# Patient Record
Sex: Female | Born: 1950 | Hispanic: No | Marital: Married | Smoking: Former smoker
Health system: Southern US, Community
[De-identification: ages and names within clinical notes are randomized; demographics above are authoritative.]

## PROBLEM LIST (undated history)

## (undated) DIAGNOSIS — Z923 Personal history of irradiation: Secondary | ICD-10-CM

## (undated) DIAGNOSIS — C50919 Malignant neoplasm of unspecified site of unspecified female breast: Secondary | ICD-10-CM

## (undated) DIAGNOSIS — I10 Essential (primary) hypertension: Secondary | ICD-10-CM

## (undated) HISTORY — DX: Essential (primary) hypertension: I10

## (undated) HISTORY — PX: TONSILLECTOMY: SUR1361

---

## 2012-07-17 ENCOUNTER — Other Ambulatory Visit: Payer: Self-pay | Admitting: Specialist

## 2012-07-17 ENCOUNTER — Ambulatory Visit
Admission: RE | Admit: 2012-07-17 | Discharge: 2012-07-17 | Disposition: A | Discharge status: Home / Self Care | Payer: No Typology Code available for payment source | Source: Ambulatory Visit | Attending: Specialist | Admitting: Specialist

## 2012-07-17 DIAGNOSIS — R7611 Nonspecific reaction to tuberculin skin test without active tuberculosis: Secondary | ICD-10-CM

## 2016-09-10 HISTORY — PX: COLONOSCOPY: SHX174

## 2016-09-18 ENCOUNTER — Other Ambulatory Visit: Payer: Self-pay | Admitting: Internal Medicine

## 2016-09-18 DIAGNOSIS — Z1231 Encounter for screening mammogram for malignant neoplasm of breast: Secondary | ICD-10-CM

## 2016-10-02 ENCOUNTER — Ambulatory Visit: Payer: Self-pay

## 2016-10-12 ENCOUNTER — Ambulatory Visit
Admission: RE | Admit: 2016-10-12 | Discharge: 2016-10-12 | Disposition: A | Discharge status: Home / Self Care | Payer: Medicare Other | Source: Ambulatory Visit | Attending: Internal Medicine | Admitting: Internal Medicine

## 2016-10-12 ENCOUNTER — Encounter: Payer: Self-pay | Admitting: Radiology

## 2016-10-12 DIAGNOSIS — Z1231 Encounter for screening mammogram for malignant neoplasm of breast: Secondary | ICD-10-CM

## 2016-10-15 ENCOUNTER — Other Ambulatory Visit: Payer: Self-pay | Admitting: Internal Medicine

## 2016-10-15 ENCOUNTER — Encounter: Payer: Self-pay | Admitting: Gastroenterology

## 2016-10-15 DIAGNOSIS — R928 Other abnormal and inconclusive findings on diagnostic imaging of breast: Secondary | ICD-10-CM

## 2016-11-16 ENCOUNTER — Ambulatory Visit
Admission: RE | Admit: 2016-11-16 | Discharge: 2016-11-16 | Disposition: A | Discharge status: Home / Self Care | Payer: Medicare Other | Source: Ambulatory Visit | Attending: Internal Medicine | Admitting: Internal Medicine

## 2016-11-16 ENCOUNTER — Other Ambulatory Visit: Payer: Self-pay | Admitting: Internal Medicine

## 2016-11-16 DIAGNOSIS — R928 Other abnormal and inconclusive findings on diagnostic imaging of breast: Secondary | ICD-10-CM

## 2016-11-16 DIAGNOSIS — N6489 Other specified disorders of breast: Secondary | ICD-10-CM

## 2016-11-27 ENCOUNTER — Ambulatory Visit
Admission: RE | Admit: 2016-11-27 | Discharge: 2016-11-27 | Disposition: A | Discharge status: Home / Self Care | Payer: Medicare Other | Source: Ambulatory Visit | Attending: Internal Medicine | Admitting: Internal Medicine

## 2016-11-27 ENCOUNTER — Other Ambulatory Visit: Payer: Self-pay | Admitting: Internal Medicine

## 2016-11-27 DIAGNOSIS — R928 Other abnormal and inconclusive findings on diagnostic imaging of breast: Secondary | ICD-10-CM

## 2016-11-27 DIAGNOSIS — N6489 Other specified disorders of breast: Secondary | ICD-10-CM

## 2016-11-28 ENCOUNTER — Telehealth: Payer: Self-pay | Admitting: *Deleted

## 2016-11-28 NOTE — Telephone Encounter (Signed)
Confirmed [facility] for 12/05/16 at 815am .  Instructions and contact information given. Offered interpreter to husband for patient and he refused.  He speaks [place] and will translate.  Informed him he will need to sign a waiver at check in.

## 2016-11-30 ENCOUNTER — Other Ambulatory Visit: Payer: Self-pay | Admitting: *Deleted

## 2016-11-30 DIAGNOSIS — Z170 Estrogen receptor positive status [ER+]: Principal | ICD-10-CM

## 2016-11-30 DIAGNOSIS — C50211 Malignant neoplasm of upper-inner quadrant of right female breast: Secondary | ICD-10-CM | POA: Insufficient documentation

## 2016-12-05 ENCOUNTER — Other Ambulatory Visit: Payer: Self-pay | Admitting: *Deleted

## 2016-12-05 ENCOUNTER — Ambulatory Visit: Payer: Medicare Other | Attending: Surgery | Admitting: Physical Therapy

## 2016-12-05 ENCOUNTER — Ambulatory Visit
Admission: RE | Admit: 2016-12-05 | Discharge: 2016-12-05 | Disposition: A | Discharge status: Home / Self Care | Payer: Medicare Other | Source: Ambulatory Visit | Attending: Radiation Oncology | Admitting: Radiation Oncology

## 2016-12-05 ENCOUNTER — Encounter: Payer: Self-pay | Admitting: *Deleted

## 2016-12-05 ENCOUNTER — Encounter: Payer: Self-pay | Admitting: Hematology

## 2016-12-05 ENCOUNTER — Ambulatory Visit (HOSPITAL_BASED_OUTPATIENT_CLINIC_OR_DEPARTMENT_OTHER): Payer: Medicare Other | Admitting: Hematology

## 2016-12-05 ENCOUNTER — Other Ambulatory Visit (HOSPITAL_BASED_OUTPATIENT_CLINIC_OR_DEPARTMENT_OTHER): Payer: Medicare Other

## 2016-12-05 ENCOUNTER — Encounter: Payer: Self-pay | Admitting: Physical Therapy

## 2016-12-05 VITALS — BP 159/71 | HR 76 | Temp 98.3°F | Resp 19 | Ht 64.0 in | Wt 207.1 lb

## 2016-12-05 DIAGNOSIS — C50211 Malignant neoplasm of upper-inner quadrant of right female breast: Secondary | ICD-10-CM

## 2016-12-05 DIAGNOSIS — Z170 Estrogen receptor positive status [ER+]: Principal | ICD-10-CM

## 2016-12-05 DIAGNOSIS — R293 Abnormal posture: Secondary | ICD-10-CM | POA: Diagnosis present

## 2016-12-05 DIAGNOSIS — Z720 Tobacco use: Secondary | ICD-10-CM

## 2016-12-05 DIAGNOSIS — I10 Essential (primary) hypertension: Secondary | ICD-10-CM | POA: Diagnosis not present

## 2016-12-05 DIAGNOSIS — C50311 Malignant neoplasm of lower-inner quadrant of right female breast: Secondary | ICD-10-CM | POA: Insufficient documentation

## 2016-12-05 LAB — COMPREHENSIVE METABOLIC PANEL
ALBUMIN: 4 g/dL (ref 3.5–5.0)
ALT: 19 U/L (ref 0–55)
AST: 15 U/L (ref 5–34)
Alkaline Phosphatase: 85 U/L (ref 40–150)
Anion Gap: 10 mEq/L (ref 3–11)
BUN: 13.4 mg/dL (ref 7.0–26.0)
CO2: 25 meq/L (ref 22–29)
Calcium: 9.5 mg/dL (ref 8.4–10.4)
Chloride: 105 mEq/L (ref 98–109)
Creatinine: 0.8 mg/dL (ref 0.6–1.1)
EGFR: 77 mL/min/{1.73_m2} — ABNORMAL LOW (ref >90)
GLUCOSE: 115 mg/dL (ref 70–140)
POTASSIUM: 4.7 meq/L (ref 3.5–5.1)
SODIUM: 139 meq/L (ref 136–145)
Total Bilirubin: 0.38 mg/dL (ref 0.20–1.20)
Total Protein: 7.5 g/dL (ref 6.4–8.3)

## 2016-12-05 LAB — CBC WITH DIFFERENTIAL/PLATELET
BASO%: 2.2 % — ABNORMAL HIGH (ref 0.0–2.0)
BASOS ABS: 0.1 10*3/uL (ref 0.0–0.1)
EOS%: 2.7 % (ref 0.0–7.0)
Eosinophils Absolute: 0.2 10*3/uL (ref 0.0–0.5)
HCT: 40.6 % (ref 34.8–46.6)
HEMOGLOBIN: 13.8 g/dL (ref 11.6–15.9)
LYMPH#: 1.9 10*3/uL (ref 0.9–3.3)
LYMPH%: 34.6 % (ref 14.0–49.7)
MCH: 31.2 pg (ref 25.1–34.0)
MCHC: 33.9 g/dL (ref 31.5–36.0)
MCV: 92 fL (ref 79.5–101.0)
MONO#: 0.7 10*3/uL (ref 0.1–0.9)
MONO%: 12.2 % (ref 0.0–14.0)
NEUT#: 2.7 10*3/uL (ref 1.5–6.5)
NEUT%: 48.3 % (ref 38.4–76.8)
Platelets: 283 10*3/uL (ref 145–400)
RBC: 4.42 10*6/uL (ref 3.70–5.45)
RDW: 13.4 % (ref 11.2–14.5)
WBC: 5.6 10*3/uL (ref 3.9–10.3)

## 2016-12-05 NOTE — Therapy (Signed)
[place], [place], [address] Phone: [phone]   Fax:  [phone]  Physical Therapy Evaluation  Patient Details  Name: Crystal Logan MRN: [id number] Date of Birth: [birth year] Referring Provider: Dr. Alphonsa Overall  Encounter Date: 12/05/2016      PT End of Session - 12/05/16 1159    Visit Number 1   Number of Visits 1   PT Start Time 0920   PT Stop Time 0942  Also saw pt from 1120-1130 for a total of 32 minutes   PT Time Calculation (min) 22 min   Activity Tolerance Patient tolerated treatment well   Behavior During Therapy [facility] for tasks assessed/performed      Past Medical History:  Diagnosis Date  . Hypertension     History reviewed. No pertinent surgical history.  There were no vitals filed for this visit.       Subjective Assessment - 12/05/16 1147    Subjective Patient reports she is here today to be seen by her medical team for her newly diagnosed breast cancer.   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 The size was unable to be measured because it was viewed as a distortion on ultrasound and mammogram but she is scheduled to have an MRI. It is located in the lower inner quadrant, is ER/PR positive and HER2 negative with a Ki67 of 5%.   Patient Stated Goals reduce lymphedema risk and learn post op shoulder ROM HEP   Currently in Pain? No/denies            [facility] PT Assessment - 12/05/16 0001      Assessment   Medical Diagnosis Right breast cancer   Referring Provider Dr. Alphonsa Overall   Onset Date/Surgical Date 10/12/16   Hand Dominance Right   Prior Therapy none     Precautions   Precautions Other (comment)   Precaution Comments active cancer     Restrictions   Weight Bearing Restrictions No     Balance Screen   Has the patient fallen in the past 6 months  No   Has the patient had a decrease in activity level because of a fear of falling?  No   Is the patient reluctant to leave their home because of a fear of falling?  No     Home Ecologist residence   Living Arrangements Spouse/significant other   Available Help at Discharge Family     Prior Function   Level of [place] Retired   Leisure She does not exercise     Cognition   Overall Cognitive Status Within Functional Limits for tasks assessed     Posture/Postural Control   Posture/Postural Control Postural limitations   Postural Limitations Forward head;Rounded Shoulders     ROM / Strength   AROM / PROM / Strength AROM;Strength     AROM   AROM Assessment Site Shoulder;Cervical   Right/Left Shoulder Right;Left   Right Shoulder Extension 55 Degrees   Right Shoulder Flexion 145 Degrees   Right Shoulder ABduction 159 Degrees   Right Shoulder Internal Rotation 69 Degrees   Right Shoulder External Rotation 75 Degrees   Left Shoulder Extension 63 Degrees   Left Shoulder Flexion 142 Degrees   Left Shoulder ABduction 158 Degrees   Left Shoulder Internal Rotation 63 Degrees   Left Shoulder External Rotation  82 Degrees   Cervical Flexion WNL   Cervical Extension WNL   Cervical - Right Side Bend WNL   Cervical - Left Side Bend WNL   Cervical - Right Rotation WNL   Cervical - Left Rotation WNL     Strength   Overall Strength Within functional limits for tasks performed           LYMPHEDEMA/ONCOLOGY QUESTIONNAIRE - 12/05/16 1156      Type   Cancer Type Right breast cancer     Lymphedema Assessments   Lymphedema Assessments Upper extremities     Right Upper Extremity Lymphedema   10 cm Proximal to Olecranon Process 32.9 cm   Olecranon Process 28 cm   10 cm Proximal to Ulnar Styloid Process 26.5 cm   Just Proximal to Ulnar Styloid Process 17.7 cm   Across Hand at [employer] 21.5 cm   At [place] of 2nd Digit  7.1 cm     Left Upper Extremity Lymphedema   10 cm Proximal to Olecranon Process 32.2 cm   Olecranon Process 28.6 cm   10 cm Proximal to Ulnar Styloid Process 25.4 cm   Just Proximal to Ulnar Styloid Process 17.8 cm   Across Hand at [employer] 21.3 cm   At [place] of 2nd Digit 7 cm           Patient was instructed today in a home exercise program today for post op shoulder range of motion. These included active assist shoulder flexion in sitting, scapular retraction, wall walking with shoulder abduction, and hands behind head external rotation.  She was encouraged to do these twice a day, holding 3 seconds and repeating 5 times when permitted by her physician.                 PT Education - 12/05/16 1157    Education provided Yes   Education Details Lymphedema risk reduction and post op shoulder ROM HEP   Person(s) Educated Patient;Spouse   Methods Explanation;Demonstration;Handout   Comprehension Returned demonstration;Verbalized understanding              Breast Clinic Goals - 12/05/16 1204      Patient will be able to verbalize understanding of pertinent lymphedema risk reduction practices relevant to her diagnosis specifically related to skin care.   Time 1   Period Days   Status Achieved     Patient will be able to return demonstrate and/or verbalize understanding of the post-op home exercise program related to regaining shoulder range of motion.   Time 1   Period Days   Status Achieved     Patient will be able to verbalize understanding of the importance of attending the postoperative After Breast Cancer Class for further lymphedema risk reduction education and therapeutic exercise.   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 The size was unable to be measured because it was viewed  as a distortion on ultrasound and mammogram but she is scheduled to have an MRI. It is located in the lower inner quadrant, is ER/PR positive and HER2 negative with a Ki67 of 5%. Her multidisciplinary medical team met prior to her assessments to determine a recommended treatment plan. She is planning to have a right lumpectomy with a sentinel  node biopsy followed by Oncotype testing, radiation, and anti-estrogen therapy. She may benefit from post op PT to regain shoulder ROM and reduce lymphedema risk. Due to her alck of comorbidities, her eval is of low complexity.   Rehab Potential Excellent   Clinical Impairments Affecting Rehab Potential None except language barrier   PT Frequency One time visit   PT Treatment/Interventions Therapeutic exercise;Patient/family education   PT Next Visit Plan Will f/u after surgery to determine PT needs   PT Home Exercise Plan Post op shoulder ROM HEP   Consulted and Agree with Plan of Care Patient;Family member/caregiver   Family Member Consulted Husband      Patient will benefit from skilled therapeutic intervention in order to improve the following deficits and impairments:  Decreased range of motion, Pain, Decreased knowledge of precautions, Postural dysfunction  Visit Diagnosis: Abnormal posture - Plan: PT plan of care cert/re-cert  Carcinoma of lower-inner quadrant of right breast in female, estrogen receptor positive ([place]) - Plan: PT plan of care cert/re-cert  Patient will follow up at outpatient cancer rehab if needed following surgery.  If the patient requires physical therapy at that time, a specific plan will be dictated and sent to the referring physician for approval. The patient was educated today on appropriate basic range of motion exercises to begin post operatively and the importance of attending the After Breast Cancer class following surgery.  Patient was educated today on lymphedema risk reduction practices as it pertains to recommendations  that will benefit the patient immediately following surgery.  She verbalized good understanding.  No additional physical therapy is indicated at this time.        G-Codes - 12-30-16 1204    Functional Assessment Tool Used (Outpatient Only) Clinical Judgement   Functional Limitation Other PT primary   Other PT Primary Current Status (P9432) At least 1 percent but less than 20 percent impaired, limited or restricted   Other PT Primary Goal Status (X6147) At least 1 percent but less than 20 percent impaired, limited or restricted   Other PT Primary Discharge Status (W9295) At least 1 percent but less than 20 percent impaired, limited or restricted       Problem List Patient Active Problem List   Diagnosis Date Noted  . Malignant neoplasm of upper-inner quadrant of right breast in female, estrogen receptor positive ([place]) 11/30/2016    Annia Friendly, PT 12-30-16 12:06 PM  [place], [place], [address] Phone: [phone]   Fax:  [phone]  Name: Nakiya Rallis MRN: [id number] Date of Birth: 09-02-1951

## 2016-12-05 NOTE — Progress Notes (Signed)
Nutrition Assessment  Reason for Assessment:  Pt seen in Breast Clinic  ASSESSMENT:   66 year old female with new diagnosis of right breast mass.  Past medical history reviewed.  Patient husband refused interpreter for patient.    Husband reports patient with good appetite.  Medications:  reviewed  Labs: reviewed  Anthropometrics:   Height: 64 inches Weight: 207 lb BMI: 35.6   NUTRITION DIAGNOSIS: Food and nutrition related knowledge deficit related to new diagnosis of breast cancer as evidenced by no prior need for nutrition related information.  INTERVENTION:   Discussed and provided packet of information regarding nutritional tips for breast cancer patients.  Questions answered.  Teachback method used.  Contact information provided and patient knows to contact me with questions/concerns.    MONITORING, EVALUATION, and GOAL: Pt will consume a healthy plant based diet to maintain lean body mass throughout treatment.   Marae Cottrell B. Zenia Resides, [place], [place] Registered Dietitian [phone] (pager)

## 2016-12-05 NOTE — Patient Instructions (Signed)

## 2016-12-05 NOTE — Progress Notes (Signed)
Clinical Social Work [facility] Psychosocial Distress Screening Crystal Logan  Patient completed distress screening protocol and scored a 3 on the Psychosocial Distress Thermometer which indicates mild distress. Clinical Social Worker met with patient and patients husband in [facility] to assess for distress and other psychosocial needs. Patient stated she was feeling overwhelmed but felt "better" after meeting with the treatment team and getting more information on her treatment plan. CSW and patient discussed common feeling and emotions when being diagnosed with cancer, and the importance of support during treatment. CSW informed patient of the support team and support services at [facility]. CSW provided contact information and encouraged patient to call with any questions or concerns.   ONCBCN DISTRESS SCREENING 12/05/2016  Screening Type Initial Screening  Distress experienced in past week (1-10) 3  Practical problem type Insurance  Information Concerns Type Lack of info about diagnosis;Lack of info about treatment    Crystal Logan, MSW, LCSW, OSW-C Clinical Social Worker [facility] [phone]

## 2016-12-05 NOTE — Progress Notes (Signed)
Radiation Oncology         [phone] ________________________________  Initial outpatient Consultation  Name: Crystal Logan MRN: [id number]  Date: 12/05/2016  DOB: 1950-10-18  FK:CLEXNTZ,GYF, MD  Alphonsa Overall, MD   REFERRING PHYSICIAN: Alphonsa Overall, MD  DIAGNOSIS:    ICD-9-CM ICD-10-CM   1. 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 female who had a screening detected right breast asymmetry at the 3 o'clock position. No correlate on ultrasound. Axilla negative on ultrasound. 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   On review of systems the patient denies any concerns at this time. It should be noted the patient's husband is translating for her during this consultation. An independent translator was offered several times and declined by the patient.   Of note, the patient was age 52 at first menstrual period. She is postmenopausal. The patient has carried one child to term. Her last colonoscopy was 2016. She has never had a bone density scan. The patient's first mammogram was February 2018.   The patient is a current smoker - 5 to 6 cigs daily, smoker since age 51.  PREVIOUS RADIATION THERAPY: No  PAST MEDICAL HISTORY:  has a past medical history of Hypertension.    PAST SURGICAL HISTORY: no other surgeries reported by patient  FAMILY HISTORY: no  other cancers reported by patient in family  SOCIAL HISTORY:  reports that she has been smoking.  She has a 23.50 pack-year smoking history. She has never used smokeless tobacco. She reports that she drinks alcohol. She reports that she does not use drugs.  The patient is a current smoker.  ALLERGIES: Patient has no known allergies.  MEDICATIONS:  Current Outpatient Prescriptions  Medication Sig Dispense Refill  . olmesartan-hydrochlorothiazide (BENICAR HCT) 20-12.5 MG tablet Take 1 tablet by mouth daily.     No current facility-administered medications for this encounter.     REVIEW OF SYSTEMS: A 10+ POINT REVIEW OF SYSTEMS WAS OBTAINED including neurology, dermatology, psychiatry, cardiac, respiratory, lymph, extremities, GI, GU, Musculoskeletal, constitutional, breasts, reproductive, HEENT.  All pertinent positives are noted in the HPI.  All others are negative.   PHYSICAL EXAM:  Vitals with BMI 12/05/2016  Height _0   Weight 207 lbs 2 oz  BMI 74.9  Systolic 449  Diastolic 71  Pulse 76  Respirations 19  General: Alert and oriented, in no acute distress. HEENT: Oropharynx and oral cavity are clear. Neck: Neck is supple, no palpable cervical or supraclavicular lymphadenopathy. Heart: Regular in rate and rhythm with no murmurs. Chest: Clear to auscultation bilaterally. Abdomen: Soft, non tender, non distended. Extremities: No edema in ankles or wrists. Lymphatics: see Neck Exam Skin: see Breast Exam. Breasts: On examination of the left breast no palpable masses are noted in the breast or axilla. On examination of the right breast there is a bruise from previous biopsy in the upper-inner quadrant. No obvious  mass appreciated under the biopsy site other than some superficial thickening approximately >1 cm in size which could be post-biopsy change. No palpable axillary nodes.   ECOG = 0   LABORATORY DATA:  Lab Results  Component Value Date   WBC 5.6 12/05/2016   HGB 13.8  12/05/2016   HCT 40.6 12/05/2016   MCV 92.0 12/05/2016   PLT 283 12/05/2016   CMP     Component Value Date/Time   NA 139 12/05/2016 0829   K 4.7 12/05/2016 0829   CO2 25 12/05/2016 0829   GLUCOSE 115 12/05/2016 0829   BUN 13.4 12/05/2016 0829   CREATININE 0.8 12/05/2016 0829   CALCIUM 9.5 12/05/2016 0829   PROT 7.5 12/05/2016 0829   ALBUMIN 4.0 12/05/2016 0829   AST 15 12/05/2016 0829   ALT 19 12/05/2016 0829   ALKPHOS 85 12/05/2016 0829   BILITOT 0.38 12/05/2016 0829     RADIOGRAPHY: US Breast Ltd Uni Right Inc Axilla  Result Date: 11/16/2016 CLINICAL DATA:  Patient returns today to evaluate a possible right breast asymmetry identified on recent baseline screening mammogram. EXAM: 2D DIGITAL DIAGNOSTIC RIGHT MAMMOGRAM WITH CAD AND ADJUNCT TOMO ULTRASOUND RIGHT BREAST COMPARISON:  Baseline screening mammogram dated 10/12/2016. ACR Breast Density Category b: There are scattered areas of fibroglandular density. FINDINGS: On today's additional views with 3D tomosynthesis, there is a persistent asymmetry within the inner right breast, 3 o'clock axis region, at middle depth, with associated architectural distortion, best seen on CC tomosynthesis slice 44. Mammographic images were processed with CAD. Targeted ultrasound is performed, evaluating the inner and retroareolar right breast corresponding to the area of mammographic findings, showing no definite corresponding mass or distortion. Right axillary ultrasound was performed showing no enlarged or morphologically abnormal lymph nodes. IMPRESSION: Suspicious asymmetry/distortion within the inner right breast, 3 o'clock axis region, at middle depth, without definite sonographic correlate, for which stereotactic biopsy with 3D tomosynthesis guidance is recommended. RECOMMENDATION: Stereotactic biopsy, with 3D tomosynthesis guidance, for the suspicious right breast asymmetry/distortion. Stereotactic biopsy is scheduled for March 20th. These results  and recommendations were called by telephone at the time of interpretation on 11/16/2016 at 8:40 am to Dr. Jilda Panda , who verbally acknowledged these results. I have discussed the findings and recommendations with the patient. Results were also provided in writing at the conclusion of the visit. If applicable, a reminder letter will be sent to the patient regarding the next appointment. BI-RADS CATEGORY  4: Suspicious. Electronically Signed   By: Franki Cabot M.D.   On: 11/16/2016 08:42   Mm Diag Breast Tomo Uni Right  Result Date: 11/16/2016 CLINICAL DATA:  Patient returns today to evaluate a possible right breast asymmetry identified on recent baseline screening mammogram. EXAM: 2D DIGITAL DIAGNOSTIC RIGHT MAMMOGRAM WITH CAD AND ADJUNCT TOMO ULTRASOUND RIGHT BREAST COMPARISON:  Baseline screening mammogram dated 10/12/2016. ACR Breast Density Category b: There are scattered areas of fibroglandular density. FINDINGS: On today's additional views with 3D tomosynthesis, there is a persistent asymmetry within the inner right breast, 3 o'clock axis region, at middle depth, with associated architectural distortion, best seen on CC tomosynthesis slice 44. Mammographic images were processed with CAD. Targeted ultrasound is performed, evaluating the inner and retroareolar right breast corresponding to the area of mammographic findings, showing no definite corresponding mass or distortion. Right axillary ultrasound was performed showing no enlarged or morphologically abnormal lymph nodes. IMPRESSION: Suspicious asymmetry/distortion within the inner right breast, 3 o'clock axis region, at middle depth, without definite sonographic correlate,  for which stereotactic biopsy with 3D tomosynthesis guidance is recommended. RECOMMENDATION: Stereotactic biopsy, with 3D tomosynthesis guidance, for the suspicious right breast asymmetry/distortion. Stereotactic biopsy is scheduled for March 20th. These results and recommendations  were called by telephone at the time of interpretation on 11/16/2016 at 8:40 am to Dr. Jilda Panda , who verbally acknowledged these results. I have discussed the findings and recommendations with the patient. Results were also provided in writing at the conclusion of the visit. If applicable, a reminder letter will be sent to the patient regarding the next appointment. BI-RADS CATEGORY  4: Suspicious. Electronically Signed   By: Franki Cabot M.D.   On: 11/16/2016 08:42   Mm Clip Placement Right  Result Date: 11/27/2016 CLINICAL DATA:  Post biopsy mammogram of the right breast for clip placement. EXAM: DIAGNOSTIC RIGHT MAMMOGRAM POST STEREOTACTIC BIOPSY COMPARISON:  Previous exam(s). FINDINGS: Mammographic images were obtained following stereotactic guided biopsy of asymmetry/distortion in the right breast. The coil shaped biopsy marking clip is approximately 1.5 cm superior to the site of biopsy at the approximate 3 o'clock position in the right breast. IMPRESSION: The coil shaped biopsy marking clip is approximately 1.5 cm superior to the site targeted for biopsy. Final Assessment: Post Procedure Mammograms for Marker Placement Electronically Signed   By: Ammie Ferrier M.D.   On: 11/27/2016 08:42   Mm Rt Breast Bx W Loc Dev 1st Lesion Image Bx Spec Stereo Guide  Addendum Date: 11/28/2016   ADDENDUM REPORT: 11/28/2016 11:58 ADDENDUM: Pathology revealed grade I to II invasive mammary carcinoma in the right breast. This was found to be concordant by Dr. Ammie Ferrier. Pathology results were discussed with the patient's husband, who served as an interpreter, by telephone. The patient reported doing well after the biopsy. Post biopsy instructions and care were reviewed and questions were answered. The patient was encouraged to call [facility] for any additional concerns. The patient was referred to the [facility] at the [facility] on December 05, 2016. Pathology results reported by Susa Raring RN, BSN on 11/28/2016. Electronically Signed   By: Ammie Ferrier M.D.   On: 11/28/2016 11:58   Result Date: 11/28/2016 CLINICAL DATA:  66 year old female presenting for stereotactic biopsy of a right breastasymmetry/distortion. EXAM: RIGHT BREAST STEREOTACTIC CORE NEEDLE BIOPSY COMPARISON:  Previous exams. FINDINGS: The patient and I discussed the procedure of stereotactic-guided biopsy including benefits and alternatives. We discussed the high likelihood of a successful procedure. We discussed the risks of the procedure including infection, bleeding, tissue injury, clip migration, and inadequate sampling. Informed written consent was given. The usual time out protocol was performed immediately prior to the procedure. Using sterile technique and 1% Lidocaine as local anesthetic, under stereotactic guidance, a 9 gauge vacuum assisted device was used to perform core needle biopsy of asymmetry/distortion in the medial right breast using a superior approach. At the conclusion of the procedure, a coil shaped tissue marker clip was deployed into the biopsy cavity. Follow-up 2-view mammogram was performed and dictated separately. IMPRESSION: Stereotactic-guided biopsy of asymmetry/distortion in the medial right breast. No apparent complications. Electronically Signed: By: Ammie Ferrier M.D. On: 11/27/2016 08:37      IMPRESSION/PLAN: Crystal Logan is a delightful patient with right breast cancer.  She plans to have an MRI to stage her breasts, and the breast conserving surgery.  It was a pleasure meeting the patient today. We discussed the risks, benefits, and side effects of adjuvant radiotherapy.  I recommend radiotherapy to the right breast to reduce her risk of locoregional recurrence by 2/3.  We discussed that radiation would take approximately 6 weeks to complete and that I would give the patient a few weeks to heal following surgery before  starting treatment planning. If chemotherapy were to be given, this would precede radiotherapy. We spoke about acute effects including skin irritation and fatigue as well as much less common late effects including internal organ injury or irritation. We spoke about the latest technology that is used to minimize the risk of late effects for patients undergoing radiotherapy to the breast or chest wall. No guarantees of treatment were given. The patient is enthusiastic about proceeding with treatment. I look forward to participating in the patient's care if indicated.  I will await her referral back to me for postoperative follow-up and eventual CT simulation/treatment planning.  I counselled the patient on smoking cessation today. She will try OTC smoking cessation gum or patches, and will let us know if she needs a prescription for this. I advised the patient that cessation will improve her rate of recovery from treatments.   __________________________________________   Eppie Gibson, MD  This document serves as a record of services personally performed by Eppie Gibson, MD. It was created on her behalf by Maryla Morrow, a trained medical scribe. The creation of this record is based on the scribe's personal observations and the provider's statements to them. This document has been checked and approved by the attending provider.

## 2016-12-05 NOTE — Progress Notes (Signed)
[facility]  Telephone:([phone]) [phone] Fax:([phone]) [place] Note   Patient Care Team: Jilda Panda, MD as PCP - General (Internal Medicine) Alphonsa Overall, MD as Consulting Physician (General Surgery) Truitt Merle, MD as Consulting Physician (Hematology) Eppie Gibson, MD as Attending Physician (Radiation Oncology) 12/05/2016  CHIEF COMPLAINTS/PURPOSE OF CONSULTATION:  Newly diagnosed right breast cancer  Oncology History   Cancer Staging Malignant neoplasm of upper-inner quadrant of right breast in female, estrogen receptor positive ([place]) Staging form: Breast, AJCC 8th Edition - Clinical stage from 11/27/2016: Stage IA (cT1c, cN0, cM0, G2, ER: Positive, PR: Positive, HER2: Negative) - Signed by Truitt Merle, MD on 12/05/2016       Malignant neoplasm of upper-inner quadrant of right breast in female, estrogen receptor positive ([place])   11/16/2016 Mammogram    Diagnostic mammogram and US showed suspicious asymmetry/distortion within the inner right breast, 3 o'clock axis region, at middle depth, without definite sonographic Correlated, axilla (-)      11/27/2016 Receptors her2    ER 90%+ PR 40%+ HER2 - Ki67 5%      11/27/2016 Initial Biopsy    Breast, right, needle core biopsy, 3:00 o'clock - INVASIVE LOBULAR CARCINOMA, G1-2      11/27/2016 Initial Diagnosis    Malignant neoplasm of upper-inner quadrant of right breast in female, estrogen receptor positive ([place])        HISTORY OF PRESENTING ILLNESS (12/05/2016):  Crystal Logan 65 y.o. female who presented for routine screening mammogram on 10/12/2016 and was found to have possible right breast asymmetry. Accordingly the patient underwent diagnostic mammogram and right breast ultrasound on 11/16/2016. These scans showed persistent asymmetry within the inner right breast, 3 o'clock axis region, at middle depth, with associated architectural distortion. Biopsy of the right breast on 11/27/2016 revealed invasive  mammary carcinoma, grade 1-2 / ER 90% / PR 40% / HER-2 negative / Ki67 5%.   The patient's case was presented to our multidisciplinary tumor board and she is seen in breast clinic today. She is here for further evaluation and treatment recommendations.   Crystal Logan is doing well overall. Our visit was facilitated by the patient's husband who translated throughout the visit. She has never had surgery before and is nervous about the procedure. She did not self palpate the breast mass. This was her first screening mammogram. She injured her breast three years ago and had intermittent pain in the breast since. This pain was worse with change in the weather. She reports a palpable lump post-biopsy. She denies pain at the biopsy site. She denies joint pain. She has no other major health issues. She has no family history of cancer. She smokes about 6-7 cigarettes daily. She has been smoking since 66 years-old. She did not experience hot flashes with menopause. She has not had a bone density scan.  GYN HISTORY  Menarchal: 75 or 66 years-old LMP: Around 66 years-old Contraceptive: None HRT: None GP: 1  MEDICAL HISTORY:  Past Medical History:  Diagnosis Date  . Hypertension     SURGICAL HISTORY: History reviewed. No pertinent surgical history.  SOCIAL HISTORY: Social History   Social History  . Marital status: Married    Spouse name: N/A  . Number of children: N/A  . Years of education: N/A   Occupational History  . Not on file.   Social History Main Topics  . Smoking status: Current Every Day Smoker    Packs/day: 0.50    Years: 47.00  . Smokeless  tobacco: Never Used  . Alcohol use Yes     Comment: social drinker   . Drug use: No  . Sexual activity: Not on file   Other Topics Concern  . Not on file   Social History Narrative  . No narrative on file    FAMILY HISTORY: History reviewed. No pertinent family history.  ALLERGIES:  has No Known Allergies.  MEDICATIONS:  Current  Outpatient Prescriptions  Medication Sig Dispense Refill  . olmesartan-hydrochlorothiazide (BENICAR HCT) 20-12.5 MG tablet Take 1 tablet by mouth daily.     No current facility-administered medications for this visit.     REVIEW OF SYSTEMS:   Constitutional: Denies fevers, chills or abnormal night sweats Eyes: Denies blurriness of vision, double vision or watery eyes Ears, nose, mouth, throat, and face: Denies mucositis or sore throat Respiratory: Denies cough, dyspnea or wheezes Cardiovascular: Denies palpitation, chest discomfort or lower extremity swelling Gastrointestinal:  Denies nausea, heartburn or change in bowel habits Skin: Denies abnormal skin rashes (+) palpable lump post-biopsy Lymphatics: Denies new lymphadenopathy or easy bruising Neurological:Denies numbness, tingling or new weaknesses Behavioral/Psych: Mood is stable, no new changes  All other systems were reviewed with the patient and are negative.  PHYSICAL EXAMINATION: ECOG PERFORMANCE STATUS: 0 - Asymptomatic  Vitals:   12/05/16 0841  BP: (!) 159/71  Pulse: 76  Resp: 19  Temp: 98.3 F (36.8 C)   Filed Weights   12/05/16 0841  Weight: 207 lb 1.6 oz (93.9 kg)    GENERAL:alert, no distress and comfortable SKIN: skin color, texture, turgor are normal, no rashes or significant lesions EYES: normal, conjunctiva are pink and non-injected, sclera clear OROPHARYNX:no exudate, no erythema and lips, buccal mucosa, and tongue normal  NECK: supple, thyroid normal size, non-tender, without nodularity LYMPH:  no palpable lymphadenopathy in the cervical, axillary or inguinal LUNGS: clear to auscultation and percussion with normal breathing effort HEART: regular rate & rhythm and no murmurs and no lower extremity edema ABDOMEN:abdomen soft, non-tender and normal bowel sounds Musculoskeletal:no cyanosis of digits and no clubbing  PSYCH: alert & oriented x 3 with fluent speech NEURO: no focal motor/sensory  deficits BREAST: Right breast biopsy site with associated ecchymosis. There is no palpable mass or abnormalities in bilateral breasts or axillae.    LABORATORY DATA:  I have reviewed the data as listed CBC Latest Ref Rng & Units 12/05/2016  WBC 3.9 - 10.3 10e3/uL 5.6  Hemoglobin 11.6 - 15.9 g/dL 13.8  Hematocrit 34.8 - 46.6 % 40.6  Platelets 145 - 400 10e3/uL 283    CMP Latest Ref Rng & Units 12/05/2016  Glucose 70 - 140 mg/dl 115  BUN 7.0 - 26.0 mg/dL 13.4  Creatinine 0.6 - 1.1 mg/dL 0.8  Sodium 136 - 145 mEq/L 139  Potassium 3.5 - 5.1 mEq/L 4.7  CO2 22 - 29 mEq/L 25  Calcium 8.4 - 10.4 mg/dL 9.5  Total Protein 6.4 - 8.3 g/dL 7.5  Total Bilirubin 0.20 - 1.20 mg/dL 0.38  Alkaline Phos 40 - 150 U/L 85  AST 5 - 34 U/L 15  ALT 0 - 55 U/L 19    PATHOLOGY  Diagnosis 11/27/2016 Breast, right, needle core biopsy, 3:00 o'clock - INVASIVE MAMMARY CARCINOMA. - SEE COMMENT. Microscopic Comment The carcinoma appears grade 1-2. An E-Cadherin stain and a breast prognostic profile will be performed and the results reported separately. The results were called to [facility] on 11/28/16. (JBK:gt, 11/28/16) The malignant cells are negative for E-Cadherin, supporting a  lobular phenotype. (JBK:kh 11/29/16) PROGNOSTIC INDICATORS Results: IMMUNOHISTOCHEMICAL AND MORPHOMETRIC ANALYSIS PERFORMED MANUALLY Estrogen Receptor: 90%, POSITIVE, STRONG STAINING INTENSITY Progesterone Receptor: 40%, POSITIVE, STRONG STAINING INTENSITY Proliferation Marker Ki67: 5% FLUORESCENCE IN-SITU HYBRIDIZATION Results: HER2 - NEGATIVE RATIO OF HER2/CEP17 SIGNALS 1.31 AVERAGE HER2 COPY NUMBER PER CELL 2.75   RADIOGRAPHIC STUDIES: I have personally reviewed the radiological images as listed and agreed with the findings in the report. US Breast Ltd Uni Right Inc Axilla  Result Date: 11/16/2016 CLINICAL DATA:  Patient returns today to evaluate a possible right breast asymmetry identified on recent  baseline screening mammogram. EXAM: 2D DIGITAL DIAGNOSTIC RIGHT MAMMOGRAM WITH CAD AND ADJUNCT TOMO ULTRASOUND RIGHT BREAST COMPARISON:  Baseline screening mammogram dated 10/12/2016. ACR Breast Density Category b: There are scattered areas of fibroglandular density. FINDINGS: On today's additional views with 3D tomosynthesis, there is a persistent asymmetry within the inner right breast, 3 o'clock axis region, at middle depth, with associated architectural distortion, best seen on CC tomosynthesis slice 44. Mammographic images were processed with CAD. Targeted ultrasound is performed, evaluating the inner and retroareolar right breast corresponding to the area of mammographic findings, showing no definite corresponding mass or distortion. Right axillary ultrasound was performed showing no enlarged or morphologically abnormal lymph nodes. IMPRESSION: Suspicious asymmetry/distortion within the inner right breast, 3 o'clock axis region, at middle depth, without definite sonographic correlate, for which stereotactic biopsy with 3D tomosynthesis guidance is recommended. RECOMMENDATION: Stereotactic biopsy, with 3D tomosynthesis guidance, for the suspicious right breast asymmetry/distortion. Stereotactic biopsy is scheduled for March 20th. These results and recommendations were called by telephone at the time of interpretation on 11/16/2016 at 8:40 am to Dr. Jilda Panda , who verbally acknowledged these results. I have discussed the findings and recommendations with the patient. Results were also provided in writing at the conclusion of the visit. If applicable, a reminder letter will be sent to the patient regarding the next appointment. BI-RADS CATEGORY  4: Suspicious. Electronically Signed   By: Franki Cabot M.D.   On: 11/16/2016 08:42   Mm Diag Breast Tomo Uni Right  Result Date: 11/16/2016 CLINICAL DATA:  Patient returns today to evaluate a possible right breast asymmetry identified on recent baseline screening  mammogram. EXAM: 2D DIGITAL DIAGNOSTIC RIGHT MAMMOGRAM WITH CAD AND ADJUNCT TOMO ULTRASOUND RIGHT BREAST COMPARISON:  Baseline screening mammogram dated 10/12/2016. ACR Breast Density Category b: There are scattered areas of fibroglandular density. FINDINGS: On today's additional views with 3D tomosynthesis, there is a persistent asymmetry within the inner right breast, 3 o'clock axis region, at middle depth, with associated architectural distortion, best seen on CC tomosynthesis slice 44. Mammographic images were processed with CAD. Targeted ultrasound is performed, evaluating the inner and retroareolar right breast corresponding to the area of mammographic findings, showing no definite corresponding mass or distortion. Right axillary ultrasound was performed showing no enlarged or morphologically abnormal lymph nodes. IMPRESSION: Suspicious asymmetry/distortion within the inner right breast, 3 o'clock axis region, at middle depth, without definite sonographic correlate, for which stereotactic biopsy with 3D tomosynthesis guidance is recommended. RECOMMENDATION: Stereotactic biopsy, with 3D tomosynthesis guidance, for the suspicious right breast asymmetry/distortion. Stereotactic biopsy is scheduled for March 20th. These results and recommendations were called by telephone at the time of interpretation on 11/16/2016 at 8:40 am to Dr. Jilda Panda , who verbally acknowledged these results. I have discussed the findings and recommendations with the patient. Results were also provided in writing at the conclusion of the visit. If applicable, a reminder letter will be sent  to the patient regarding the next appointment. BI-RADS CATEGORY  4: Suspicious. Electronically Signed   By: Franki Cabot M.D.   On: 11/16/2016 08:42   Mm Clip Placement Right  Result Date: 11/27/2016 CLINICAL DATA:  Post biopsy mammogram of the right breast for clip placement. EXAM: DIAGNOSTIC RIGHT MAMMOGRAM POST STEREOTACTIC BIOPSY COMPARISON:   Previous exam(s). FINDINGS: Mammographic images were obtained following stereotactic guided biopsy of asymmetry/distortion in the right breast. The coil shaped biopsy marking clip is approximately 1.5 cm superior to the site of biopsy at the approximate 3 o'clock position in the right breast. IMPRESSION: The coil shaped biopsy marking clip is approximately 1.5 cm superior to the site targeted for biopsy. Final Assessment: Post Procedure Mammograms for Marker Placement Electronically Signed   By: Ammie Ferrier M.D.   On: 11/27/2016 08:42   Mm Rt Breast Bx W Loc Dev 1st Lesion Image Bx Spec Stereo Guide  Addendum Date: 11/28/2016   ADDENDUM REPORT: 11/28/2016 11:58 ADDENDUM: Pathology revealed grade I to II invasive mammary carcinoma in the right breast. This was found to be concordant by Dr. Ammie Ferrier. Pathology results were discussed with the patient's husband, who served as an interpreter, by telephone. The patient reported doing well after the biopsy. Post biopsy instructions and care were reviewed and questions were answered. The patient was encouraged to call [facility] for any additional concerns. The patient was referred to the [facility] at the [facility] on December 05, 2016. Pathology results reported by Susa Raring RN, BSN on 11/28/2016. Electronically Signed   By: Ammie Ferrier M.D.   On: 11/28/2016 11:58   Result Date: 11/28/2016 CLINICAL DATA:  66 year old female presenting for stereotactic biopsy of a right breastasymmetry/distortion. EXAM: RIGHT BREAST STEREOTACTIC CORE NEEDLE BIOPSY COMPARISON:  Previous exams. FINDINGS: The patient and I discussed the procedure of stereotactic-guided biopsy including benefits and alternatives. We discussed the high likelihood of a successful procedure. We discussed the risks of the procedure including infection, bleeding, tissue injury, clip migration, and inadequate  sampling. Informed written consent was given. The usual time out protocol was performed immediately prior to the procedure. Using sterile technique and 1% Lidocaine as local anesthetic, under stereotactic guidance, a 9 gauge vacuum assisted device was used to perform core needle biopsy of asymmetry/distortion in the medial right breast using a superior approach. At the conclusion of the procedure, a coil shaped tissue marker clip was deployed into the biopsy cavity. Follow-up 2-view mammogram was performed and dictated separately. IMPRESSION: Stereotactic-guided biopsy of asymmetry/distortion in the medial right breast. No apparent complications. Electronically Signed: By: Ammie Ferrier M.D. On: 11/27/2016 08:37    ASSESSMENT & PLAN:  66 y.o. post-menopausal female with   1. 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 -Giving the invasive E carcinoma histology, we recommend bilateral breast MRI with and without contrast to further evaluate extends of disease and ruled out additional lesions. -Given the early stage disease, she likely a candidate for breast conservation. She was seen by Dr. Lucia Gaskins today and he recommended right breast lumpectomy and sentinel lymph node mapping surgery soon. Patient initially was very reluctant to have surgery, after lengthy discussion and her husband's encouragement, she is agreeable to have surgery soon. -I recommend a Oncotype Dx test on the surgical sample  and we'll make a decision about adjuvant chemotherapy based on the Oncotype result. Written material of this test was given to her. She is young and fit, would be a good candidate for chemotherapy if her Oncotype recurrence score is high. -If her surgical sentinel lymph node node positive, I recommend mammaprint for further risk  stratification and guide adjuvant chemotherapy. -Giving the strong ER and PR expression in her postmenopausal status, I recommend adjuvant endocrine therapy with aromatase inhibitor for a total of 5-10 years to reduce the risk of cancer recurrence. Potential benefits and side effects were discussed with patient and she is interested. If she is going have surgery soon, I recommend adjuvant therapy with letrozole. We discussed letrozole at length today.  -She was also seen by radiation oncologist Dr. Isidore Moos today. Adjuvant breast radiation or discussed and the recommended. -We also discussed the breast cancer surveillance after her surgery. She will continue annual screening mammogram, self exam, and a routine office visit with lab and exam with [place]. -I encouraged her to have healthy diet and exercise regularly.  -She has not had a bone density scan. She will undergo one sometime in the next year.   2. Tobacco use -She currently smokes 6-7 cigarettes daily. She has been smoking since 66 years-old. She previously smoked 1 ppd but has cut down. -We discussed smoking cessation and will continue to address this at future visits.  3. HTN -She'll continue medication and follow-up with her primary care physician  Plan: -She will proceed with breast MRI -The patient is very reluctant to have surgery initially, after lengthy discussion, she agrees to have lumpectomy and sentinel lymph node biopsy by Dr. Lucia Gaskins  -Oncotype on her surgical tumor, or mammaprint if node positive. -I will see her again after she completes adjuvant breast radiation, or sooner if her Oncotype shows high-risk disease.  No orders of the defined types were placed in this encounter.   All questions were answered. The patient knows to call the clinic with any problems, questions or concerns. I spent 55 minutes counseling the patient face to face. The total time spent in the appointment was 60 minutes and more than 50% was on  counseling.  This document serves as a record of services personally performed by Truitt Merle, MD. It was created on her behalf by Arlyce Harman, a trained medical scribe. The creation of this record is based on the scribe's personal observations and the provider's statements to them. This document has been checked and approved by the attending provider.    Truitt Merle, MD 12/05/2016

## 2016-12-08 ENCOUNTER — Ambulatory Visit (HOSPITAL_COMMUNITY)
Admission: RE | Admit: 2016-12-08 | Discharge: 2016-12-08 | Disposition: A | Discharge status: Home / Self Care | Payer: Medicare Other | Source: Ambulatory Visit | Attending: Surgery | Admitting: Surgery

## 2016-12-08 DIAGNOSIS — C50211 Malignant neoplasm of upper-inner quadrant of right female breast: Secondary | ICD-10-CM | POA: Diagnosis not present

## 2016-12-08 DIAGNOSIS — Z170 Estrogen receptor positive status [ER+]: Secondary | ICD-10-CM | POA: Diagnosis not present

## 2016-12-08 MED ORDER — GADOBENATE DIMEGLUMINE 529 MG/ML IV SOLN
20.0000 mL | Freq: Once | INTRAVENOUS | Status: AC | PRN
  Administered 2016-12-08: 16 mL via INTRAVENOUS

## 2016-12-09 DIAGNOSIS — C50919 Malignant neoplasm of unspecified site of unspecified female breast: Secondary | ICD-10-CM

## 2016-12-09 HISTORY — DX: Malignant neoplasm of unspecified site of unspecified female breast: C50.919

## 2016-12-10 ENCOUNTER — Telehealth: Payer: Self-pay | Admitting: *Deleted

## 2016-12-10 NOTE — Telephone Encounter (Signed)
Spoke to pt husband. Relate no questions regarding dx or treatment care plan. Encourage to call with needs. Received verbal understanding.

## 2016-12-12 ENCOUNTER — Ambulatory Visit (AMBULATORY_SURGERY_CENTER): Payer: Self-pay

## 2016-12-12 VITALS — Ht 64.0 in | Wt 207.0 lb

## 2016-12-12 DIAGNOSIS — Z1211 Encounter for screening for malignant neoplasm of colon: Secondary | ICD-10-CM

## 2016-12-12 MED ORDER — SUPREP BOWEL PREP KIT 17.5-3.13-1.6 GM/177ML PO SOLN: 1.0000 | Freq: Once | ORAL | 0 refills | Status: AC

## 2016-12-12 NOTE — Progress Notes (Signed)
No diet meds No home oxygen No allergies to eggs or soy No past problems with anesthesia  Declined emmi 

## 2016-12-13 ENCOUNTER — Telehealth: Payer: Self-pay | Admitting: Surgery

## 2016-12-13 NOTE — Telephone Encounter (Signed)
-----   Message from Alphonsa Overall, MD sent at 12/05/2016 12:06 PM EDT ----- Regarding: see in offce after MRI I will need to see Ms. Crystal Logan in the office after the MRI.  To decide and talk about surgery.  She is [place] and her husband speaks [place].

## 2016-12-14 ENCOUNTER — Telehealth: Payer: Self-pay | Admitting: Hematology

## 2016-12-14 ENCOUNTER — Other Ambulatory Visit: Payer: Self-pay | Admitting: Hematology

## 2016-12-14 MED ORDER — LETROZOLE 2.5 MG PO TABS: 2.5000 mg | ORAL_TABLET | Freq: Every day | ORAL | 2 refills | Status: DC

## 2016-12-14 NOTE — Telephone Encounter (Signed)
Patient saw Dr. Lucia Gaskins again yesterday, still undecided about surgery due to the concern of cost. I called patient and spoke with her husband, I recommend her to start letrozole while she is trying to make a decision. The potential side effects and benefit of letrozole were discussed with patient and her husband on her last visit. He agrees. I called into her pharmacy today. I'll see her back in 4 weeks.  Truitt Merle  12/14/2016

## 2016-12-15 ENCOUNTER — Telehealth: Payer: Self-pay | Admitting: Hematology

## 2016-12-15 NOTE — Telephone Encounter (Signed)
Left message with appt date and time - sent letter out.  Scheduled appt per sch message from Dr. Burr Medico.

## 2016-12-18 ENCOUNTER — Other Ambulatory Visit: Payer: Self-pay | Admitting: Surgery

## 2016-12-18 DIAGNOSIS — C50911 Malignant neoplasm of unspecified site of right female breast: Secondary | ICD-10-CM

## 2016-12-20 ENCOUNTER — Other Ambulatory Visit: Payer: Self-pay | Admitting: Surgery

## 2016-12-20 ENCOUNTER — Other Ambulatory Visit (HOSPITAL_COMMUNITY): Payer: Self-pay | Admitting: Surgery

## 2016-12-20 DIAGNOSIS — Z170 Estrogen receptor positive status [ER+]: Principal | ICD-10-CM

## 2016-12-20 DIAGNOSIS — C50911 Malignant neoplasm of unspecified site of right female breast: Secondary | ICD-10-CM

## 2016-12-24 ENCOUNTER — Encounter (HOSPITAL_BASED_OUTPATIENT_CLINIC_OR_DEPARTMENT_OTHER): Payer: Self-pay | Admitting: *Deleted

## 2016-12-24 NOTE — Pre-Procedure Instructions (Signed)
To come pick up Boost Breeze 8 oz. - to drink by 0430 DOS

## 2016-12-26 ENCOUNTER — Encounter: Payer: Self-pay | Admitting: Gastroenterology

## 2016-12-26 ENCOUNTER — Ambulatory Visit (AMBULATORY_SURGERY_CENTER): Payer: Medicare Other | Admitting: Gastroenterology

## 2016-12-26 VITALS — BP 125/77 | HR 71 | Temp 97.8°F | Resp 16 | Ht 64.0 in | Wt 207.0 lb

## 2016-12-26 DIAGNOSIS — D124 Benign neoplasm of descending colon: Secondary | ICD-10-CM

## 2016-12-26 DIAGNOSIS — Z1211 Encounter for screening for malignant neoplasm of colon: Secondary | ICD-10-CM

## 2016-12-26 DIAGNOSIS — Z1212 Encounter for screening for malignant neoplasm of rectum: Secondary | ICD-10-CM | POA: Diagnosis not present

## 2016-12-26 MED ORDER — SODIUM CHLORIDE 0.9 % IV SOLN: 500.0000 mL | INTRAVENOUS | Status: DC

## 2016-12-26 NOTE — Progress Notes (Signed)
Called to room to assist during endoscopic procedure.  Patient ID and intended procedure confirmed with present staff. Received instructions for my participation in the procedure from the performing physician.  

## 2016-12-26 NOTE — Progress Notes (Signed)
A/ox3, pleased with MAC, report to [facility]

## 2016-12-26 NOTE — Progress Notes (Signed)
Pt's states no medical or surgical changes since previsit or office visit. 

## 2016-12-26 NOTE — Patient Instructions (Signed)
YOU HAD AN ENDOSCOPIC PROCEDURE TODAY AT THE [facility]:   Refer to the procedure report that was given to you for any specific questions about what was found during the examination.  If the procedure report does not answer your questions, please call your gastroenterologist to clarify.  If you requested that your care partner not be given the details of your procedure findings, then the procedure report has been included in a sealed envelope for you to review at your convenience later.  YOU SHOULD EXPECT: Some feelings of bloating in the abdomen. Passage of more gas than usual.  Walking can help get rid of the air that was put into your GI tract during the procedure and reduce the bloating. If you had a lower endoscopy (such as a colonoscopy or flexible sigmoidoscopy) you may notice spotting of blood in your stool or on the toilet paper. If you underwent a bowel prep for your procedure, you may not have a normal bowel movement for a few days.  Please Note:  You might notice some irritation and congestion in your nose or some drainage.  This is from the oxygen used during your procedure.  There is no need for concern and it should clear up in a day or so.  SYMPTOMS TO REPORT IMMEDIATELY:   Following lower endoscopy (colonoscopy or flexible sigmoidoscopy):  Excessive amounts of blood in the stool  Significant tenderness or worsening of abdominal pains  Swelling of the abdomen that is new, acute  Fever of 100F or higher    For urgent or emergent issues, a gastroenterologist can be reached at any hour by calling [phone].   DIET:  We do recommend a small meal at first, but then you may proceed to your regular diet.  Drink plenty of fluids but you should avoid alcoholic beverages for 24 hours.  ACTIVITY:  You should plan to take it easy for the rest of today and you should NOT DRIVE or use heavy machinery until tomorrow (because of the sedation medicines used during the test).     FOLLOW UP: Our staff will call the number listed on your records the next business day following your procedure to check on you and address any questions or concerns that you may have regarding the information given to you following your procedure. If we do not reach you, we will leave a message.  However, if you are feeling well and you are not experiencing any problems, there is no need to return our call.  We will assume that you have returned to your regular daily activities without incident.  If any biopsies were taken you will be contacted by phone or by letter within the next 1-3 weeks.  Please call us at [phone] if you have not heard about the biopsies in 3 weeks.    SIGNATURES/CONFIDENTIALITY: You and/or your care partner have signed paperwork which will be entered into your electronic medical record.  These signatures attest to the fact that that the information above on your After Visit Summary has been reviewed and is understood.  Full responsibility of the confidentiality of this discharge information lies with you and/or your care-partner.   Resume medications. Information given on polyps. 

## 2016-12-26 NOTE — Op Note (Signed)
[facility] Patient Name: Crystal Logan Procedure Date: 12/26/2016 10:34 AM MRN: [id number] Endoscopist: Milus Banister , MD Age: 66 Referring MD:  Date of Birth: 15-Jan-1951 Gender: Female Account #: [id number] Procedure:                Colonoscopy Indications:              Screening for colorectal malignant neoplasm Medicines:                Monitored Anesthesia Care Procedure:                Pre-Anesthesia Assessment:                           - Prior to the procedure, a History and Physical                            was performed, and patient medications and                            allergies were reviewed. The patient's tolerance of                            previous anesthesia was also reviewed. The risks                            and benefits of the procedure and the sedation                            options and risks were discussed with the patient.                            All questions were answered, and informed consent                            was obtained. Prior Anticoagulants: The patient has                            taken no previous anticoagulant or antiplatelet                            agents. ASA Grade Assessment: II - A patient with                            mild systemic disease. After reviewing the risks                            and benefits, the patient was deemed in                            satisfactory condition to undergo the procedure.                           After obtaining informed consent, the colonoscope  was passed under direct vision. Throughout the                            procedure, the patient's blood pressure, pulse, and                            oxygen saturations were monitored continuously. The                            Model PCF-[id number]) scope was introduced                            through the anus and advanced to the the cecum,                            identified by  appendiceal orifice and ileocecal                            valve. The colonoscopy was performed without                            difficulty. The patient tolerated the procedure                            well. The quality of the bowel preparation was                            good. The ileocecal valve, appendiceal orifice, and                            rectum were photographed. Scope In: 10:38:32 AM Scope Out: 10:51:27 AM Scope Withdrawal Time: 0 hours 9 minutes 55 seconds  Total Procedure Duration: 0 hours 12 minutes 55 seconds  Findings:                 Three sessile polyps were found in the descending                            colon. The polyps were 3 to 6 mm in size. These                            polyps were removed with a cold snare. Resection                            and retrieval were complete.                           The exam was otherwise without abnormality on                            direct and retroflexion views. Complications:            No immediate complications. Estimated blood loss:  None. Estimated Blood Loss:     Estimated blood loss: none. Impression:               - Three 3 to 6 mm polyps in the descending colon,                            removed with a cold snare. Resected and retrieved.                           - The examination was otherwise normal on direct                            and retroflexion views. Recommendation:           - Patient has a contact number available for                            emergencies. The signs and symptoms of potential                            delayed complications were discussed with the                            patient. Return to normal activities tomorrow.                            Written discharge instructions were provided to the                            patient.                           - Resume previous diet.                           - Continue present medications.                            You will receive a letter within 2-3 weeks with the                            pathology results and my final recommendations.                           If the polyp(s) is proven to be 'pre-cancerous' on                            pathology, you will need repeat colonoscopy in 3-5                            years. If the polyp(s) is NOT 'precancerous' on                            pathology then you should repeat colon cancer  screening in 10 years with colonoscopy without need                            for colon cancer screening by any method prior to                            then (including stool testing). Milus Banister, MD 12/26/2016 10:53:44 AM This report has been signed electronically.

## 2016-12-27 ENCOUNTER — Telehealth: Payer: Self-pay | Admitting: *Deleted

## 2016-12-27 ENCOUNTER — Other Ambulatory Visit: Payer: Self-pay | Admitting: Surgery

## 2016-12-27 DIAGNOSIS — Z170 Estrogen receptor positive status [ER+]: Principal | ICD-10-CM

## 2016-12-27 DIAGNOSIS — C50911 Malignant neoplasm of unspecified site of right female breast: Secondary | ICD-10-CM

## 2016-12-27 NOTE — Telephone Encounter (Signed)
  Follow up Call-  Call back number 12/26/2016  Post procedure Call Back phone  # [phone]  Permission to leave phone message Yes  Some recent data might be hidden   [facility]

## 2016-12-27 NOTE — Telephone Encounter (Signed)
  Follow up Call-  Call back number 12/26/2016  Post procedure Call Back phone  # [phone]  Permission to leave phone message Yes  Some recent data might be hidden     Patient questions:  Do you have a fever, pain , or abdominal swelling? No. Pain Score  0 *  Have you tolerated food without any problems? Yes.    Have you been able to return to your normal activities? Yes.    Do you have any questions about your discharge instructions: Diet   No. Medications  No. Follow up visit  No.  Do you have questions or concerns about your Care? No.  Actions: * If pain score is 4 or above: No action needed, pain <4.

## 2017-01-01 ENCOUNTER — Ambulatory Visit
Admission: RE | Admit: 2017-01-01 | Discharge: 2017-01-01 | Disposition: A | Discharge status: Home / Self Care | Payer: Medicare Other | Source: Ambulatory Visit | Attending: Surgery | Admitting: Surgery

## 2017-01-01 ENCOUNTER — Encounter: Payer: Self-pay | Admitting: Gastroenterology

## 2017-01-01 DIAGNOSIS — C50911 Malignant neoplasm of unspecified site of right female breast: Secondary | ICD-10-CM

## 2017-01-01 NOTE — Progress Notes (Signed)
Crystal Logan  Location: [facility] Patient #: [id number] DOB: [birth year] Undefined / Language: [name] / Race: Refused to Report/Unreported Female  History of Present Illness   The patient is a 66 year old female who presents with a complaint of discuss right breast cancer.   The PCP is Dr. Chelsea Logan  The patient was referred by Dr. Chelsea Logan  The pateint is at the Breast [facility] - Oncology is Drs. Crystal Logan and Crystal Logan She comes with her husband, Crystal Logan.  She had an MRI on 12/08/2016 that shows a 3.0 x 2.1 cm central mass (under the areola) on MRI. There is no other mass. I gave him a copy of the MRI report and explained it to him in detail. Her husband has been overwhelmed by the potential cost of the procedures. Crystal Logan was going to meet with him about our cost - though I think that they are only a small fraction.  Plan: 1) tamoxifen (or antiestrogen) until they decide on surgery, 2) possibly right lumpectomy and right axillary SLNBx alone, 3) I told him to contact the rad tx peoplt about their cost and what is covered --- so at this time, everything is up in the air. Will they do surgery now or later? Will they take the pill? Will they allow rad tx?  History of breast cancer: She had never had a mammogram before. she did not feel anything in her breast. She has no family history of breast cancer. Crystal Logan and her were married in 2013, and she moved to the [place] in the same year. Crystal Logan has been in the [place] since 1984. He was Teacher, early years/pre in [place] prior to moving her. She wants to wait for any surgery (or treatment) until her daughter comes to the [place] this summer, but I pushed her to think about doing surgery earlier. Mammograms: At the [facility] on 11/16/2016 showed a distortion at the 3 o'clock position of the right breast. There was not size noted. (Note on biopsy - clip is 1.5 cm superior to bx) Biopsy: Right breast biospy on 11/27/2016  ([id number]) showed ILC, ER - 90%, PR - 40%, ki67 - 5%, and Her2Neu - neg. Family history of breast or ovarian cancer: None On hormone therapy: None  I discussed the options for breast cancer treatment with the patient. The patient is at the [facility], which includes medical oncology and radiation oncology. I discussed the surgical options of lumpectomy vs. mastectomy. If mastectomy, there is the possibility of reconstruction. I discussed the options of lymph node biopsy. The treatment plan depends on the pathologic staging of the tumor and the patient's personal wishes. The risks of surgery include, but are not limited to, bleeding, infection, the need for further surgery, and nerve injury. The patient has been given literature on the treatment of breast cancer.  Past Medical History: 1. HTN x years 2. Smokes about 1/2 ppd - her husband is pushing her hard to quit  Social History: Married to [employer]. He speaks [place] fairly well. I think he understands the options. She does not speak [place]. Daughter, Crystal Logan, lives in [place] and is coming to visit in June.   Past Surgical History (Crystal Logan, [place]; 12/13/2016 4:07 PM) No pertinent past surgical history   Diagnostic Studies History (Crystal Logan, [place]; 12/13/2016 4:07 PM) Colonoscopy  5-10 years ago Mammogram  1-3 years ago  Social History (Crystal Logan, [place]; 12/13/2016 4:07 PM) Alcohol use  Occasional alcohol use. Caffeine use  Coffee.  No drug use  Tobacco use  Current every day smoker.  Family History (Crystal Logan, [place]; 12/13/2016 4:07 PM) Alcohol Abuse  Father.  Pregnancy / Birth History (Crystal Logan, [place]; 12/13/2016 4:07 PM) Age at menarche  77 years. Gravida  1 Length (months) of breastfeeding  3-6 Maternal age  37-30 Para  1  Other Problems (Crystal Logan, [place]; 12/13/2016 4:07 PM) No pertinent past medical history     Review of Systems  (Crystal Logan CMA; 12/13/2016 4:07 PM) General Not Present- Appetite Loss, Chills, Fatigue, Fever, Night Sweats, Weight Gain and Weight Loss. Skin Not Present- Change in Wart/Mole, Dryness, Hives, Jaundice, New Lesions, Non-Healing Wounds, Rash and Ulcer. HEENT Not Present- Earache, Hearing Loss, Hoarseness, Nose Bleed, Oral Ulcers, Ringing in the Ears, Seasonal Allergies, Sinus Pain, Sore Throat, Visual Disturbances, Wears glasses/contact lenses and Yellow Eyes. Respiratory Not Present- Bloody sputum, Chronic Cough, Difficulty Breathing, Snoring and Wheezing. Breast Not Present- Breast Mass, Breast Pain, Nipple Discharge and Skin Changes. Cardiovascular Not Present- Chest Pain, Difficulty Breathing Lying Down, Leg Cramps, Palpitations, Rapid Heart Rate, Shortness of Breath and Swelling of Extremities. Gastrointestinal Not Present- Abdominal Pain, Bloating, Bloody Stool, Change in Bowel Habits, Chronic diarrhea, Constipation, Difficulty Swallowing, Excessive gas, Gets full quickly at meals, Hemorrhoids, Indigestion, Nausea, Rectal Pain and Vomiting. Female Genitourinary Not Present- Frequency, Nocturia, Painful Urination, Pelvic Pain and Urgency. Musculoskeletal Not Present- Back Pain, Joint Pain, Joint Stiffness, Muscle Pain, Muscle Weakness and Swelling of Extremities. Neurological Not Present- Decreased Memory, Fainting, Headaches, Numbness, Seizures, Tingling, Tremor, Trouble walking and Weakness. Psychiatric Not Present- Anxiety, Bipolar, Change in Sleep Pattern, Depression, Fearful and Frequent crying. Endocrine Not Present- Cold Intolerance, Excessive Hunger, Hair Changes, Heat Intolerance, Hot flashes and New Diabetes. Hematology Not Present- Blood Thinners, Easy Bruising, Excessive bleeding, Gland problems, HIV and Persistent Infections.   Physical Exam  General: WN slightly overweight WF alert and generally healthy appearing. She does not speak [place]. I depended on her husbad to talk  to her. They refused the translator phone. Skin: Inspection and palpation of the skin unremarkable.  Eyes: Conjunctivae white, pupils equal. Face, ears, nose, mouth, and throat: Face - normal. Normal ears and nose. Lips and teeth normal.  Neck: Supple. No mass. Trachea midline. No thyroid mass.  Lymph Nodes: No supraclavicular or cervical adenopathy. No axillary adenopathy.  Lungs: Normal respiratory effort. Clear to auscultation and symmetric breath sounds. Cardiovascular: Regular rate and rythm. Normal auscultation of the heart. No murmur or rub.  Breasts: Right - Bruise in the upper inner right breast. I could not feel a specific mass. Left - I felt no mass or nodule.  Abdomen: Soft. No mass. Liver and spleen not palpable. No tenderness. No hernia. Normal bowel sounds. No abdominal scars. Rectal: Not done.  Musculoskeletal/extremities: Normal gait. Good strength and ROM in upper and lower extremities.   Neurologic: Grossly intact to motor and sensory function.   Psychiatric: Has normal mood and affect. Judgement and insight appear normal.    Assessment & Plan  1.  CANCER OF RIGHT BREAST, STAGE 1, ESTROGEN RECEPTOR POSITIVE (C50.911)  Story: Mammograms: At the [facility] on 11/16/2016 showed a distortion at the 3 o'clock position of the right breast. There was not size noted. (Note on biopsy - clip is 1.5 cm superior to bx)  Biopsy: Right breast biospy on 11/27/2016 ([id number]) showed ILC, ER - 90%, PR - 40%, ki67 - 5%, and Her2Neu - neg.  Oncology - Crystal Logan and [place]  Plan:   1) The  patient and her husband are concerned about the cost They'll need to make a decision. I recommend antiestrogen if they are going to delay the surgery.   2) Right lumpectomy (with seed localization) with right axillary SLNBx -  Addendum Note(Angelina Venard H. Lucia Gaskins MD; 12/17/2016 3:59 PM)  I called and spoke with pt's husband. I recommend her to start letrozole  while she is trying to make a decision about her surgery. He agrees, she will start soon. I will see her back in 4 weeks for follow up Thanks,  Yan  2. HTN x years 3. Smokes about 1/2 ppd - her husband is pushing her hard to quit   Alphonsa Overall, MD, [facility] Surgery Pager: [phone] Office phone:  [phone]

## 2017-01-02 ENCOUNTER — Encounter (HOSPITAL_BASED_OUTPATIENT_CLINIC_OR_DEPARTMENT_OTHER): Payer: Self-pay | Admitting: Certified Registered"

## 2017-01-02 ENCOUNTER — Ambulatory Visit
Admission: RE | Admit: 2017-01-02 | Discharge: 2017-01-02 | Disposition: A | Discharge status: Home / Self Care | Payer: Medicare Other | Source: Ambulatory Visit | Attending: Surgery | Admitting: Surgery

## 2017-01-02 ENCOUNTER — Ambulatory Visit (HOSPITAL_BASED_OUTPATIENT_CLINIC_OR_DEPARTMENT_OTHER): Payer: Medicare Other | Admitting: Certified Registered"

## 2017-01-02 ENCOUNTER — Encounter (HOSPITAL_BASED_OUTPATIENT_CLINIC_OR_DEPARTMENT_OTHER)
Admission: RE | Disposition: A | Discharge status: Home / Self Care | Payer: Self-pay | Source: Ambulatory Visit | Attending: Surgery

## 2017-01-02 ENCOUNTER — Encounter (HOSPITAL_COMMUNITY)
Admission: RE | Admit: 2017-01-02 | Discharge: 2017-01-02 | Disposition: A | Discharge status: Home / Self Care | Payer: Medicare Other | Source: Ambulatory Visit | Attending: Surgery | Admitting: Surgery

## 2017-01-02 ENCOUNTER — Ambulatory Visit (HOSPITAL_BASED_OUTPATIENT_CLINIC_OR_DEPARTMENT_OTHER)
Admission: RE | Admit: 2017-01-02 | Discharge: 2017-01-02 | Disposition: A | Discharge status: Home / Self Care | Payer: Medicare Other | Source: Ambulatory Visit | Attending: Surgery | Admitting: Surgery

## 2017-01-02 DIAGNOSIS — C50111 Malignant neoplasm of central portion of right female breast: Secondary | ICD-10-CM | POA: Diagnosis not present

## 2017-01-02 DIAGNOSIS — Z170 Estrogen receptor positive status [ER+]: Principal | ICD-10-CM

## 2017-01-02 DIAGNOSIS — C50911 Malignant neoplasm of unspecified site of right female breast: Secondary | ICD-10-CM

## 2017-01-02 DIAGNOSIS — F17210 Nicotine dependence, cigarettes, uncomplicated: Secondary | ICD-10-CM | POA: Diagnosis not present

## 2017-01-02 DIAGNOSIS — Z79899 Other long term (current) drug therapy: Secondary | ICD-10-CM | POA: Diagnosis not present

## 2017-01-02 DIAGNOSIS — I10 Essential (primary) hypertension: Secondary | ICD-10-CM | POA: Insufficient documentation

## 2017-01-02 DIAGNOSIS — C773 Secondary and unspecified malignant neoplasm of axilla and upper limb lymph nodes: Secondary | ICD-10-CM | POA: Insufficient documentation

## 2017-01-02 HISTORY — PX: BREAST LUMPECTOMY WITH RADIOACTIVE SEED AND SENTINEL LYMPH NODE BIOPSY: SHX6550

## 2017-01-02 HISTORY — PX: BREAST LUMPECTOMY: SHX2

## 2017-01-02 HISTORY — DX: Malignant neoplasm of unspecified site of unspecified female breast: C50.919

## 2017-01-02 SURGERY — BREAST LUMPECTOMY WITH RADIOACTIVE SEED AND SENTINEL LYMPH NODE BIOPSY
Anesthesia: General | Site: Breast | Laterality: Right

## 2017-01-02 MED ORDER — LIDOCAINE 2% (20 MG/ML) 5 ML SYRINGE
INTRAMUSCULAR | Status: AC
  Filled 2017-01-02: qty 5

## 2017-01-02 MED ORDER — MEPERIDINE HCL 25 MG/ML IJ SOLN: 6.2500 mg | INTRAMUSCULAR | Status: DC | PRN

## 2017-01-02 MED ORDER — BUPIVACAINE HCL (PF) 0.25 % IJ SOLN
INTRAMUSCULAR | Status: AC
  Filled 2017-01-02: qty 30

## 2017-01-02 MED ORDER — OXYCODONE HCL 5 MG PO TABS: 5.0000 mg | ORAL_TABLET | Freq: Once | ORAL | Status: DC | PRN

## 2017-01-02 MED ORDER — ACETAMINOPHEN 500 MG PO TABS
ORAL_TABLET | ORAL | Status: AC
  Filled 2017-01-02: qty 2

## 2017-01-02 MED ORDER — PROMETHAZINE HCL 25 MG/ML IJ SOLN
6.2500 mg | INTRAMUSCULAR | Status: DC | PRN
Start: 2017-01-02 — End: 2017-01-02

## 2017-01-02 MED ORDER — METHYLENE BLUE 0.5 % INJ SOLN
INTRAVENOUS | Status: AC
  Filled 2017-01-02: qty 10

## 2017-01-02 MED ORDER — OXYCODONE HCL 5 MG/5ML PO SOLN: 5.0000 mg | Freq: Once | ORAL | Status: DC | PRN

## 2017-01-02 MED ORDER — LACTATED RINGERS IV SOLN
INTRAVENOUS | Status: DC
  Administered 2017-01-02 (×2): via INTRAVENOUS

## 2017-01-02 MED ORDER — ONDANSETRON HCL 4 MG/2ML IJ SOLN
INTRAMUSCULAR | Status: DC | PRN
  Administered 2017-01-02: 4 mg via INTRAVENOUS

## 2017-01-02 MED ORDER — LACTATED RINGERS IV SOLN: INTRAVENOUS | Status: DC

## 2017-01-02 MED ORDER — FENTANYL CITRATE (PF) 100 MCG/2ML IJ SOLN
INTRAMUSCULAR | Status: AC
  Filled 2017-01-02: qty 2

## 2017-01-02 MED ORDER — MIDAZOLAM HCL 2 MG/2ML IJ SOLN
1.0000 mg | INTRAMUSCULAR | Status: DC | PRN
  Administered 2017-01-02: 2 mg via INTRAVENOUS

## 2017-01-02 MED ORDER — FENTANYL CITRATE (PF) 100 MCG/2ML IJ SOLN
50.0000 ug | INTRAMUSCULAR | Status: AC | PRN
  Administered 2017-01-02 (×4): 50 ug via INTRAVENOUS

## 2017-01-02 MED ORDER — CEFAZOLIN SODIUM-DEXTROSE 2-4 GM/100ML-% IV SOLN
2.0000 g | INTRAVENOUS | Status: AC
  Administered 2017-01-02: 2 g via INTRAVENOUS

## 2017-01-02 MED ORDER — PROPOFOL 10 MG/ML IV BOLUS
INTRAVENOUS | Status: DC | PRN
  Administered 2017-01-02: 200 mg via INTRAVENOUS

## 2017-01-02 MED ORDER — METHYLENE BLUE 0.5 % INJ SOLN
INTRAVENOUS | Status: DC | PRN
  Administered 2017-01-02: 5 mL via INTRAMUSCULAR

## 2017-01-02 MED ORDER — CHLORHEXIDINE GLUCONATE CLOTH 2 % EX PADS: 6.0000 | MEDICATED_PAD | Freq: Once | CUTANEOUS | Status: DC

## 2017-01-02 MED ORDER — PROPOFOL 10 MG/ML IV BOLUS
INTRAVENOUS | Status: AC
  Filled 2017-01-02: qty 20

## 2017-01-02 MED ORDER — SCOPOLAMINE 1 MG/3DAYS TD PT72: 1.0000 | MEDICATED_PATCH | Freq: Once | TRANSDERMAL | Status: DC | PRN

## 2017-01-02 MED ORDER — BUPIVACAINE-EPINEPHRINE (PF) 0.5% -1:200000 IJ SOLN
INTRAMUSCULAR | Status: DC | PRN
  Administered 2017-01-02: 30 mL

## 2017-01-02 MED ORDER — MIDAZOLAM HCL 2 MG/2ML IJ SOLN
INTRAMUSCULAR | Status: AC
  Filled 2017-01-02: qty 2

## 2017-01-02 MED ORDER — ONDANSETRON HCL 4 MG/2ML IJ SOLN
INTRAMUSCULAR | Status: AC
  Filled 2017-01-02: qty 2

## 2017-01-02 MED ORDER — EPHEDRINE SULFATE 50 MG/ML IJ SOLN
INTRAMUSCULAR | Status: DC | PRN
  Administered 2017-01-02: 10 mg via INTRAVENOUS

## 2017-01-02 MED ORDER — LIDOCAINE HCL (CARDIAC) 20 MG/ML IV SOLN
INTRAVENOUS | Status: DC | PRN
  Administered 2017-01-02: 30 mg via INTRAVENOUS

## 2017-01-02 MED ORDER — ACETAMINOPHEN 500 MG PO TABS
1000.0000 mg | ORAL_TABLET | ORAL | Status: AC
  Administered 2017-01-02: 1000 mg via ORAL

## 2017-01-02 MED ORDER — HYDROCODONE-ACETAMINOPHEN 5-325 MG PO TABS: 1.0000 | ORAL_TABLET | Freq: Four times a day (QID) | ORAL | 0 refills | Status: DC | PRN

## 2017-01-02 MED ORDER — CEFAZOLIN SODIUM-DEXTROSE 2-4 GM/100ML-% IV SOLN
INTRAVENOUS | Status: AC
  Filled 2017-01-02: qty 100

## 2017-01-02 MED ORDER — DEXAMETHASONE SODIUM PHOSPHATE 4 MG/ML IJ SOLN
INTRAMUSCULAR | Status: DC | PRN
  Administered 2017-01-02: 10 mg via INTRAVENOUS

## 2017-01-02 MED ORDER — TECHNETIUM TC 99M SULFUR COLLOID FILTERED
1.0000 | Freq: Once | INTRAVENOUS | Status: AC | PRN
  Administered 2017-01-02: 1 via INTRADERMAL

## 2017-01-02 MED ORDER — BUPIVACAINE HCL (PF) 0.5 % IJ SOLN
INTRAMUSCULAR | Status: AC
  Filled 2017-01-02: qty 30

## 2017-01-02 MED ORDER — DEXAMETHASONE SODIUM PHOSPHATE 10 MG/ML IJ SOLN
INTRAMUSCULAR | Status: AC
  Filled 2017-01-02: qty 1

## 2017-01-02 MED ORDER — SODIUM CHLORIDE 0.9 % IJ SOLN
INTRAMUSCULAR | Status: AC
  Filled 2017-01-02: qty 10

## 2017-01-02 MED ORDER — BUPIVACAINE HCL 0.25 % IJ SOLN
INTRAMUSCULAR | Status: DC | PRN
  Administered 2017-01-02: 15 mL

## 2017-01-02 MED ORDER — GABAPENTIN 300 MG PO CAPS
300.0000 mg | ORAL_CAPSULE | ORAL | Status: AC
  Administered 2017-01-02: 300 mg via ORAL

## 2017-01-02 MED ORDER — FENTANYL CITRATE (PF) 100 MCG/2ML IJ SOLN: 25.0000 ug | INTRAMUSCULAR | Status: DC | PRN

## 2017-01-02 MED ORDER — GABAPENTIN 300 MG PO CAPS
ORAL_CAPSULE | ORAL | Status: AC
  Filled 2017-01-02: qty 1

## 2017-01-02 SURGICAL SUPPLY — 50 items
BENZOIN TINCTURE PRP APPL 2/3 (GAUZE/BANDAGES/DRESSINGS) IMPLANT
BINDER BREAST LRG (GAUZE/BANDAGES/DRESSINGS) IMPLANT
BINDER BREAST MEDIUM (GAUZE/BANDAGES/DRESSINGS) IMPLANT
BINDER BREAST XLRG (GAUZE/BANDAGES/DRESSINGS) IMPLANT
BINDER BREAST XXLRG (GAUZE/BANDAGES/DRESSINGS) ×3 IMPLANT
BLADE SURG 15 STRL LF DISP TIS (BLADE) ×1 IMPLANT
BLADE SURG 15 STRL SS (BLADE) ×2
CANISTER SUC SOCK COL 7IN (MISCELLANEOUS) IMPLANT
CANISTER SUCT 1200ML W/VALVE (MISCELLANEOUS) ×3 IMPLANT
CHLORAPREP W/TINT 26ML (MISCELLANEOUS) ×3 IMPLANT
CLIP TI WIDE RED SMALL 6 (CLIP) ×3 IMPLANT
CLOSURE WOUND 1/2 X4 (GAUZE/BANDAGES/DRESSINGS)
COVER BACK TABLE 60X90IN (DRAPES) ×3 IMPLANT
COVER MAYO STAND STRL (DRAPES) ×3 IMPLANT
COVER PROBE W GEL 5X96 (DRAPES) ×3 IMPLANT
DECANTER SPIKE VIAL GLASS SM (MISCELLANEOUS) IMPLANT
DERMABOND ADVANCED (GAUZE/BANDAGES/DRESSINGS) ×4
DERMABOND ADVANCED .7 DNX12 (GAUZE/BANDAGES/DRESSINGS) ×2 IMPLANT
DEVICE DUBIN W/COMP PLATE 8390 (MISCELLANEOUS) ×3 IMPLANT
DRAPE LAPAROSCOPIC ABDOMINAL (DRAPES) ×3 IMPLANT
DRAPE UTILITY XL STRL (DRAPES) ×3 IMPLANT
DRSG PAD ABDOMINAL 8X10 ST (GAUZE/BANDAGES/DRESSINGS) ×3 IMPLANT
ELECT COATED BLADE 2.86 ST (ELECTRODE) ×3 IMPLANT
ELECT REM PT RETURN 9FT ADLT (ELECTROSURGICAL) ×3
ELECTRODE REM PT RTRN 9FT ADLT (ELECTROSURGICAL) ×1 IMPLANT
GAUZE SPONGE 4X4 12PLY STRL (GAUZE/BANDAGES/DRESSINGS) ×6 IMPLANT
GLOVE SURG SIGNA 7.5 PF LTX (GLOVE) ×6 IMPLANT
GOWN STRL REUS W/ TWL LRG LVL3 (GOWN DISPOSABLE) ×1 IMPLANT
GOWN STRL REUS W/ TWL XL LVL3 (GOWN DISPOSABLE) ×1 IMPLANT
GOWN STRL REUS W/TWL LRG LVL3 (GOWN DISPOSABLE) ×2
GOWN STRL REUS W/TWL XL LVL3 (GOWN DISPOSABLE) ×2
KIT MARKER MARGIN INK (KITS) ×3 IMPLANT
NDL SAFETY ECLIPSE 18X1.5 (NEEDLE) ×1 IMPLANT
NEEDLE HYPO 18GX1.5 SHARP (NEEDLE) ×2
NEEDLE HYPO 25X1 1.5 SAFETY (NEEDLE) ×6 IMPLANT
NS IRRIG 1000ML POUR BTL (IV SOLUTION) ×3 IMPLANT
PACK BASIN DAY SURGERY FS (CUSTOM PROCEDURE TRAY) ×3 IMPLANT
PENCIL BUTTON HOLSTER BLD 10FT (ELECTRODE) ×3 IMPLANT
SHEET MEDIUM DRAPE 40X70 STRL (DRAPES) ×3 IMPLANT
SLEEVE SCD COMPRESS KNEE MED (MISCELLANEOUS) ×3 IMPLANT
SPONGE LAP 18X18 X RAY DECT (DISPOSABLE) ×3 IMPLANT
STRIP CLOSURE SKIN 1/2X4 (GAUZE/BANDAGES/DRESSINGS) IMPLANT
SUT MNCRL AB 4-0 PS2 18 (SUTURE) ×3 IMPLANT
SUT VICRYL 3-0 CR8 SH (SUTURE) ×3 IMPLANT
SYR CONTROL 10ML LL (SYRINGE) ×3 IMPLANT
TOWEL OR 17X24 6PK STRL BLUE (TOWEL DISPOSABLE) ×3 IMPLANT
TOWEL OR NON WOVEN STRL DISP B (DISPOSABLE) ×3 IMPLANT
TUBE CONNECTING 20'X1/4 (TUBING) ×1
TUBE CONNECTING 20X1/4 (TUBING) ×2 IMPLANT
YANKAUER SUCT BULB TIP NO VENT (SUCTIONS) ×3 IMPLANT

## 2017-01-02 NOTE — Anesthesia Procedure Notes (Signed)
Anesthesia Regional Block: Pectoralis block   Pre-Anesthetic Checklist: ,, timeout performed, Correct Patient, Correct Site, Correct Laterality, Correct Procedure, Correct Position, site marked, Risks and benefits discussed,  Surgical consent,  Pre-op evaluation,  At surgeon's request and post-op pain management  Laterality: Right  Prep: chloraprep       Needles:  Injection technique: Single-shot  Needle Type: Echogenic Needle     Needle Length: 9cm  Needle Gauge: 21     Additional Needles:   Procedures: ultrasound guided,,,,,,,,  Narrative:  Start time: 01/02/2017 7:55 AM End time: 01/02/2017 8:00 AM Injection made incrementally with aspirations every 5 mL.  Performed by: Personally  Anesthesiologist: Suella Broad D  Additional Notes: Tolerated well.

## 2017-01-02 NOTE — Anesthesia Procedure Notes (Signed)
Procedure Name: LMA Insertion Date/Time: 01/02/2017 8:37 AM Performed by: Lilleigh Hechavarria D Pre-anesthesia Checklist: Patient identified, Emergency Drugs available, Suction available and Patient being monitored Patient Re-evaluated:Patient Re-evaluated prior to inductionOxygen Delivery Method: Circle system utilized Preoxygenation: Pre-oxygenation with 100% oxygen Intubation Type: IV induction Ventilation: Mask ventilation without difficulty LMA: LMA inserted LMA Size: 3.0 Number of attempts: 1 Airway Equipment and Method: Bite block Placement Confirmation: positive ETCO2 Tube secured with: Tape Dental Injury: Teeth and Oropharynx as per pre-operative assessment

## 2017-01-02 NOTE — Interval H&P Note (Signed)
History and Physical Interval Note:  01/02/2017 8:28 AM  Crystal Logan  has presented today for surgery, with the diagnosis of RIGHT BREAST CANCER  The various methods of treatment have been discussed with the patient and family.  Husband at bedside.  Seed in place.  After consideration of risks, benefits and other options for treatment, the patient has consented to  Procedure(s): RIGHT BREAST LUMPECTOMY WITH RADIOACTIVE SEED AND RIGHT AXILLARY SENTINEL LYMPH NODE BIOPSY (Right) as a surgical intervention .  The patient's history has been reviewed, patient examined, no change in status, stable for surgery.  I have reviewed the patient's chart and labs.  Questions were answered to the patient's satisfaction.     Ryan Ogborn H

## 2017-01-02 NOTE — Anesthesia Preprocedure Evaluation (Addendum)
Anesthesia Evaluation  Patient identified by MRN, date of birth, ID band Patient awake    Reviewed: Allergy & Precautions, NPO status , Patient's Chart, lab work & pertinent test results  Airway Mallampati: II  TM Distance: >3 FB Neck ROM: Full    Dental  (+) Teeth Intact, Dental Advisory Given   Pulmonary Current Smoker,    breath sounds clear to auscultation       Cardiovascular hypertension, Pt. on medications  Rhythm:Regular Rate:Normal     Neuro/Psych negative neurological ROS  negative psych ROS   GI/Hepatic negative GI ROS, Neg liver ROS,   Endo/Other  negative endocrine ROS  Renal/GU negative Renal ROS  negative genitourinary   Musculoskeletal negative musculoskeletal ROS (+)   Abdominal   Peds negative pediatric ROS (+)  Hematology negative hematology ROS (+)   Anesthesia Other Findings Day of surgery medications reviewed with the patient.  Reproductive/Obstetrics negative OB ROS                           Anesthesia Physical Anesthesia Plan  ASA: II  Anesthesia Plan: General   Post-op Pain Management:  Regional for Post-op pain   Induction: Intravenous  Airway Management Planned: LMA  Additional Equipment:   Intra-op Plan:   Post-operative Plan: Extubation in OR  Informed Consent: I have reviewed the patients History and Physical, chart, labs and discussed the procedure including the risks, benefits and alternatives for the proposed anesthesia with the patient or authorized representative who has indicated his/her understanding and acceptance.   Dental advisory given  Plan Discussed with: CRNA  Anesthesia Plan Comments:         Anesthesia Quick Evaluation

## 2017-01-02 NOTE — Addendum Note (Signed)
Addendum  created 01/02/17 1226 by Effie Berkshire, MD   Anesthesia Intra Blocks edited, Anesthesia Intra Meds edited, Child order released for a procedure order, Sign clinical note

## 2017-01-02 NOTE — H&P (View-Only) (Signed)
Crystal Logan  Location: [facility] Patient #: [id number] DOB: [birth year] Undefined / Language: [name] / Race: Refused to Report/Unreported Female  History of Present Illness   The patient is a 66 year old female who presents with a complaint of discuss right breast cancer.   The PCP is Dr. Chelsea Aus  The patient was referred by Dr. Chelsea Aus  The pateint is at the Breast [facility] - Oncology is Drs. Burr Medico and Isidore Moos She comes with her husband, [name].  She had an MRI on 12/08/2016 that shows a 3.0 x 2.1 cm central mass (under the areola) on MRI. There is no other mass. I gave him a copy of the MRI report and explained it to him in detail. Her husband has been overwhelmed by the potential cost of the procedures. Crystal Logan was going to meet with him about our cost - though I think that they are only a small fraction.  Plan: 1) tamoxifen (or antiestrogen) until they decide on surgery, 2) possibly right lumpectomy and right axillary SLNBx alone, 3) I told him to contact the rad tx peoplt about their cost and what is covered --- so at this time, everything is up in the air. Will they do surgery now or later? Will they take the pill? Will they allow rad tx?  History of breast cancer: She had never had a mammogram before. she did not feel anything in her breast. She has no family history of breast cancer. [name] and her were married in 2013, and she moved to the [place] in the same year. [name] has been in the [place] since 1984. He was Teacher, early years/pre in [place] prior to moving her. She wants to wait for any surgery (or treatment) until her daughter comes to the [place] this summer, but I pushed her to think about doing surgery earlier. Mammograms: At the [facility] on 11/16/2016 showed a distortion at the 3 o'clock position of the right breast. There was not size noted. (Note on biopsy - clip is 1.5 cm superior to bx) Biopsy: Right breast biospy on 11/27/2016  ([id number]) showed ILC, ER - 90%, PR - 40%, ki67 - 5%, and Her2Neu - neg. Family history of breast or ovarian cancer: None On hormone therapy: None  I discussed the options for breast cancer treatment with the patient. The patient is at the [facility], which includes medical oncology and radiation oncology. I discussed the surgical options of lumpectomy vs. mastectomy. If mastectomy, there is the possibility of reconstruction. I discussed the options of lymph node biopsy. The treatment plan depends on the pathologic staging of the tumor and the patient's personal wishes. The risks of surgery include, but are not limited to, bleeding, infection, the need for further surgery, and nerve injury. The patient has been given literature on the treatment of breast cancer.  Past Medical History: 1. HTN x years 2. Smokes about 1/2 ppd - her husband is pushing her hard to quit  Social History: Married to [employer]. He speaks [place] fairly well. I think he understands the options. She does not speak [place]. Daughter, [name], lives in [place] and is coming to visit in June.   Past Surgical History (April Staton, [place]; 12/13/2016 4:07 PM) No pertinent past surgical history   Diagnostic Studies History (April Staton, [place]; 12/13/2016 4:07 PM) Colonoscopy  5-10 years ago Mammogram  1-3 years ago  Social History (April Staton, [place]; 12/13/2016 4:07 PM) Alcohol use  Occasional alcohol use. Caffeine use  Coffee.  No drug use  Tobacco use  Current every day smoker.  Family History (April Staton, [place]; 12/13/2016 4:07 PM) Alcohol Abuse  Father.  Pregnancy / Birth History (April Staton, [place]; 12/13/2016 4:07 PM) Age at menarche  77 years. Gravida  1 Length (months) of breastfeeding  3-6 Maternal age  37-30 Para  1  Other Problems (April Staton, [place]; 12/13/2016 4:07 PM) No pertinent past medical history     Review of Systems  (April Staton CMA; 12/13/2016 4:07 PM) General Not Present- Appetite Loss, Chills, Fatigue, Fever, Night Sweats, Weight Gain and Weight Loss. Skin Not Present- Change in Wart/Mole, Dryness, Hives, Jaundice, New Lesions, Non-Healing Wounds, Rash and Ulcer. HEENT Not Present- Earache, Hearing Loss, Hoarseness, Nose Bleed, Oral Ulcers, Ringing in the Ears, Seasonal Allergies, Sinus Pain, Sore Throat, Visual Disturbances, Wears glasses/contact lenses and Yellow Eyes. Respiratory Not Present- Bloody sputum, Chronic Cough, Difficulty Breathing, Snoring and Wheezing. Breast Not Present- Breast Mass, Breast Pain, Nipple Discharge and Skin Changes. Cardiovascular Not Present- Chest Pain, Difficulty Breathing Lying Down, Leg Cramps, Palpitations, Rapid Heart Rate, Shortness of Breath and Swelling of Extremities. Gastrointestinal Not Present- Abdominal Pain, Bloating, Bloody Stool, Change in Bowel Habits, Chronic diarrhea, Constipation, Difficulty Swallowing, Excessive gas, Gets full quickly at meals, Hemorrhoids, Indigestion, Nausea, Rectal Pain and Vomiting. Female Genitourinary Not Present- Frequency, Nocturia, Painful Urination, Pelvic Pain and Urgency. Musculoskeletal Not Present- Back Pain, Joint Pain, Joint Stiffness, Muscle Pain, Muscle Weakness and Swelling of Extremities. Neurological Not Present- Decreased Memory, Fainting, Headaches, Numbness, Seizures, Tingling, Tremor, Trouble walking and Weakness. Psychiatric Not Present- Anxiety, Bipolar, Change in Sleep Pattern, Depression, Fearful and Frequent crying. Endocrine Not Present- Cold Intolerance, Excessive Hunger, Hair Changes, Heat Intolerance, Hot flashes and New Diabetes. Hematology Not Present- Blood Thinners, Easy Bruising, Excessive bleeding, Gland problems, HIV and Persistent Infections.   Physical Exam  General: WN slightly overweight WF alert and generally healthy appearing. She does not speak [place]. I depended on her husbad to talk  to her. They refused the translator phone. Skin: Inspection and palpation of the skin unremarkable.  Eyes: Conjunctivae white, pupils equal. Face, ears, nose, mouth, and throat: Face - normal. Normal ears and nose. Lips and teeth normal.  Neck: Supple. No mass. Trachea midline. No thyroid mass.  Lymph Nodes: No supraclavicular or cervical adenopathy. No axillary adenopathy.  Lungs: Normal respiratory effort. Clear to auscultation and symmetric breath sounds. Cardiovascular: Regular rate and rythm. Normal auscultation of the heart. No murmur or rub.  Breasts: Right - Bruise in the upper inner right breast. I could not feel a specific mass. Left - I felt no mass or nodule.  Abdomen: Soft. No mass. Liver and spleen not palpable. No tenderness. No hernia. Normal bowel sounds. No abdominal scars. Rectal: Not done.  Musculoskeletal/extremities: Normal gait. Good strength and ROM in upper and lower extremities.   Neurologic: Grossly intact to motor and sensory function.   Psychiatric: Has normal mood and affect. Judgement and insight appear normal.    Assessment & Plan  1.  CANCER OF RIGHT BREAST, STAGE 1, ESTROGEN RECEPTOR POSITIVE (C50.911)  Story: Mammograms: At the [facility] on 11/16/2016 showed a distortion at the 3 o'clock position of the right breast. There was not size noted. (Note on biopsy - clip is 1.5 cm superior to bx)  Biopsy: Right breast biospy on 11/27/2016 ([id number]) showed ILC, ER - 90%, PR - 40%, ki67 - 5%, and Her2Neu - neg.  Oncology - Burr Medico and [place]  Plan:   1) The  patient and her husband are concerned about the cost They'll need to make a decision. I recommend antiestrogen if they are going to delay the surgery.   2) Right lumpectomy (with seed localization) with right axillary SLNBx -  Addendum Note(Mykeal Carrick H. Lucia Gaskins MD; 12/17/2016 3:59 PM)  I called and spoke with pt's husband. I recommend her to start letrozole  while she is trying to make a decision about her surgery. He agrees, she will start soon. I will see her back in 4 weeks for follow up Thanks,  Yan  2. HTN x years 3. Smokes about 1/2 ppd - her husband is pushing her hard to quit   Alphonsa Overall, MD, [facility] Pager: [phone] Office phone:  [phone]

## 2017-01-02 NOTE — Discharge Instructions (Signed)
[facility] - DISCHARGE INSTRUCTIONS TO PATIENT  Activity:  Lifting - no lifting more than 15 pounds for 7 days, then no limit  Wound Care:   Leave bandages and binder for 2 days.  They you may remove everything and shower.        May use ice pack on wound.  Diet:  As tolerated  Follow up appointment:  Call Dr. Pollie Friar office [facility]) at [phone] for an appointment in 2 - 3 weeks.  Medications and dosages:  Resume your home medications.  You have a prescription for:  Vicodin  Call Dr. Lucia Gaskins or his office  [phone]) if you have:  Temperature greater than 100.4,  Severe uncontrolled pain,  Redness, tenderness, or signs of infection (pain, swelling, redness, odor or green/yellow discharge around the site),  Any other questions or concerns you may have after discharge.  In an emergency, call 911 or go to an Emergency Department at a nearby hospital.   Post Anesthesia Home Care Instructions  Activity: Get plenty of rest for the remainder of the day. A responsible individual must stay with you for 24 hours following the procedure.  For the next 24 hours, DO NOT: -Drive a car -Paediatric nurse -Drink alcoholic beverages -Take any medication unless instructed by your physician -Make any legal decisions or sign important papers.  Meals: Start with liquid foods such as gelatin or soup. Progress to regular foods as tolerated. Avoid greasy, spicy, heavy foods. If nausea and/or vomiting occur, drink only clear liquids until the nausea and/or vomiting subsides. Call your physician if vomiting continues.  Special Instructions/Symptoms: Your throat may feel dry or sore from the anesthesia or the breathing tube placed in your throat during surgery. If this causes discomfort, gargle with warm salt water. The discomfort should disappear within 24 hours.  If you had a scopolamine patch placed behind your ear for the management of post- operative  nausea and/or vomiting:  1. The medication in the patch is effective for 72 hours, after which it should be removed.  Wrap patch in a tissue and discard in the trash. Wash hands thoroughly with soap and water. 2. You may remove the patch earlier than 72 hours if you experience unpleasant side effects which may include dry mouth, dizziness or visual disturbances. 3. Avoid touching the patch. Wash your hands with soap and water after contact with the patch.

## 2017-01-02 NOTE — Op Note (Signed)
01/02/2017  10:01 AM  PATIENT:  Crystal Logan DOB: 08-24-1951 MRN: [id number]  PREOP DIAGNOSIS:   RIGHT BREAST CANCER  POSTOP DIAGNOSIS:    Right breast cancer, 3 o'clock/subareolar position (T2, N0)  PROCEDURE:   Procedure(s): RIGHT BREAST LUMPECTOMY WITH RADIOACTIVE SEED AND RIGHT AXILLARY SENTINEL LYMPH NODE BIOPSY, Injection of peri areolar area of breast with methylene blue (1.0 cc), deep sentinel lymph node biopsy  SURGEON:   Alphonsa Overall, M.D.  ANESTHESIA:   general  CRNA: Ernesta Amble Blocker, CRNA  General  EBL:  minimal  ml  DRAINS:  none   LOCAL MEDICATIONS USED:   15 cc of 1/4% marcaine, right pectoral block by anesthesia  SPECIMEN:   Right breast lumpectomy (6 color paint), right axillary sentinel lymph node (counts 650, background 5, blue)  COUNTS CORRECT:  YES  INDICATIONS FOR PROCEDURE:  Crystal Logan is a 66 y.o. (DOB: 07-22-1951) white female whose primary care physician is MOREIRA,ROY, MD and comes for right breast lumpectomy and right axillary sentinel lymph node biopsy.   She was seen at the breast [facility] with Drs. Burr Medico and [place].   The options for breast cancer treatment have been discussed with the patient and her husband.  She does not speak [place] and much of the translation was through her husband. She elected to proceed with lumpectomy and axillary sentinel lymph node.        The indications and potential complications of surgery were explained to the patient. Potential complications include, but are not limited to, bleeding, infection, the need for further surgery, and nerve injury.     She had a I131 seed placed on 01/01/2017 in her right breast at [facility].  I confirmed the presence of the I131 seed in the pre op area using the Neoprobe.  The seed is in the 3 o'clock/subareolar position of the right breast.   In the holding area, her right areola was injected with 1 millicurie of Technitium Sulfur Colloid.  OPERATIVE NOTE:   The patient was taken to  room # 7 at [facility] where she underwent a general anesthesia  supervised by CRNA: Ernesta Amble Blocker, CRNA. Her right breast and axilla were prepped with  ChloraPrep and sterilely draped.    A time-out and the surgical check list was reviewed.    I injected about 0.5 mL of 40% methylene blue around her right areola.   I turned attention to the cancer which was about at the 3 o'clock/subareolar position of the right breast.   I used the Neoprobe to identify the I131 seed.  I made a superior circumareolar incision that I extended medially about 3 cm.  I tried to excise an area around the tumor of at least 1 cm.    I excised this block of breast tissue approximately 6 cm by 7 cm  in diameter.  I took the dissection down to the chest wall.  I painted the lumpectomy specimen with the 6 color paint kit and did a specimen mammogram which confirmed the mass, clip, and the seed were all in the right position in the specimen.  The specimen was sent to pathology who called back to confirm that they have the seed and the specimen.   I then started the right deep axillary sentinel lymph node biopsy. I made an incision in the right axilla.  I found a hot area at the junction of the breast and the pectoralis major muscle, deep in the axilla. I cut down  and  identified a hot node that had counts of 650 and the background has 5 counts. The lymph node was blue. I checked her internal mammary nodes and supraclavicular nodes with the neoprobe and found no other hot area. The axillary node was then sent to pathology.    I then irrigated the wound with saline. I infiltrated approximately 15 mL of 1/4% Marcaine between the incisions. She had had a right pectoral block in the pre op. I placed 4 clips to mark biopsy cavity, at 12, 3, 6, and 9 o'clock.  I then closed all the wounds in layers using 3-0 Vicryl sutures for the deep layer. At the skin, I closed the incisions with a 4-0 Monocryl suture. The incisions were then  painted with Dermabond.  She had gauze place over the wounds and placed in a breast binder.   The patient tolerated the procedure well, was transported to the recovery room in good condition. Sponge and needle count were correct at the end of the case.   Final pathology is pending.   Alphonsa Overall, MD, [facility] Pager: [phone] Office phone:  [phone]

## 2017-01-02 NOTE — Anesthesia Postprocedure Evaluation (Addendum)
Anesthesia Post Note  Patient: Crystal Logan  Procedure(s) Performed: Procedure(s) (LRB): RIGHT BREAST LUMPECTOMY WITH RADIOACTIVE SEED AND RIGHT AXILLARY SENTINEL LYMPH NODE BIOPSY (Right)  Patient location during evaluation: PACU Anesthesia Type: General and Regional Level of consciousness: awake and alert Pain management: pain level controlled Vital Signs Assessment: post-procedure vital signs reviewed and stable Respiratory status: spontaneous breathing, nonlabored ventilation, respiratory function stable and patient connected to nasal cannula oxygen Cardiovascular status: blood pressure returned to baseline and stable Postop Assessment: no signs of nausea or vomiting Anesthetic complications: no       Last Vitals:  Vitals:   01/02/17 1100 01/02/17 1123  BP: 137/74 (!) 146/79  Pulse: 88 81  Resp: (!) 22 18  Temp:  36.6 C    Last Pain:  Vitals:   01/02/17 1123  TempSrc:   PainSc: [employer] [place]

## 2017-01-02 NOTE — Transfer of Care (Signed)
Immediate Anesthesia Transfer of Care Note  Patient: Carrera Rus  Procedure(s) Performed: Procedure(s): RIGHT BREAST LUMPECTOMY WITH RADIOACTIVE SEED AND RIGHT AXILLARY SENTINEL LYMPH NODE BIOPSY (Right)  Patient Location: PACU  Anesthesia Type:GA combined with regional for post-op pain  Level of Consciousness: awake and patient cooperative  Airway & Oxygen Therapy: Patient Spontanous Breathing and Patient connected to face mask oxygen  Post-op Assessment: Report given to RN and Post -op Vital signs reviewed and stable  Post vital signs: Reviewed and stable  Last Vitals:  Vitals:   01/02/17 0805 01/02/17 0810  BP: 123/63 128/60  Pulse: 86 82  Resp: (!) 26 14  Temp:      Last Pain:  Vitals:   01/02/17 0701  TempSrc: Oral         Complications: No apparent anesthesia complications

## 2017-01-02 NOTE — Progress Notes (Signed)
Nuc med inj performed by nuc med staff. Additional fentanyl given for comfort. Pt tol well. Will call husband to bedside and update/provide emotional support

## 2017-01-02 NOTE — Progress Notes (Signed)
Assisted Dr. Smith Robert with right, ultrasound guided, pectoralis block. Side rails up, monitors on throughout procedure. See vital signs in flow sheet. Tolerated Procedure well.

## 2017-01-03 ENCOUNTER — Encounter (HOSPITAL_BASED_OUTPATIENT_CLINIC_OR_DEPARTMENT_OTHER): Payer: Self-pay | Admitting: Surgery

## 2017-01-08 DIAGNOSIS — Z923 Personal history of irradiation: Secondary | ICD-10-CM

## 2017-01-08 HISTORY — DX: Personal history of irradiation: Z92.3

## 2017-01-09 NOTE — Progress Notes (Signed)
[place]  Telephone:([phone] Fax:([phone]) [phone]  Clinic Follow-Up Note   Patient Care Team: Jilda Panda, MD as PCP - General (Internal Medicine) Alphonsa Overall, MD as Consulting Physician (General Surgery) Truitt Merle, MD as Consulting Physician (Hematology) Eppie Gibson, MD as Attending Physician (Radiation Oncology) 01/10/2017  CHIEF COMPLAINTS:  Follow up right breast cancer  Oncology History   Cancer Staging Malignant neoplasm of upper-inner quadrant of right breast in female, estrogen receptor positive ([place]) Staging form: Breast, AJCC 8th Edition - Clinical stage from 11/27/2016: Stage IA (cT1c, cN0, cM0, G2, ER: Positive, PR: Positive, HER2: Negative) - Signed by Truitt Merle, MD on 12/05/2016       Malignant neoplasm of upper-inner quadrant of right breast in female, estrogen receptor positive ([place])   11/16/2016 Mammogram    Diagnostic mammogram and US showed suspicious asymmetry/distortion within the inner right breast, 3 o'clock axis region, at middle depth, without definite sonographic Correlated, axilla (-)      11/27/2016 Receptors her2    ER 90%+ PR 40%+ HER2 - Ki67 5%      11/27/2016 Initial Biopsy    Breast, right, needle core biopsy, 3:00 o'clock - INVASIVE LOBULAR CARCINOMA, G1-2      11/27/2016 Initial Diagnosis    Malignant neoplasm of upper-inner quadrant of right breast in female, estrogen receptor positive ([place])      12/10/2016 Imaging    MR Breast Bilateral IMPRESSION: 1. Post biopsy changes and associated non masslike enhancement in the medial aspect of the right breast. 2. Left breast is negative. 3. No MRI evidence for adenopathy.      12/26/2016 Procedure    Colonoscopy  - Three 3 to 6 mm polyps in the descending colon, removed with a cold snare. Resected and retrieved. - The examination was otherwise normal on direct and retroflexion views.      12/26/2016 Pathology Results    Diagnosis Surgical [P], descending, polyps  (3) -TUBULAR ADENOMAS. -NO HIGH GRADE DYSPLASIA OR MALIGNANCY IDENTIFIED.      01/02/2017 Pathology Results    Diagnosis  1. Breast, lumpectomy, Right - INVASIVE LOBULAR CARCINOMA, 2.8 CM. - INVASIVE CARCINOMA BROADLY INVOLVES INFERIOR MARGIN. - PREVIOUS BIOPSY SITE. - FOCAL LYMPH VASCULAR INVOLVEMENT BY TUMOR. 2. Lymph node, sentinel, biopsy, Right Axillary - METASTATIC CARCINOMA IN ONE LYMPH NODE WITH EXTRANODAL EXTENSION (1/1).      01/02/2017 Surgery    Right Breast lumpectomy with sentinel lymph node biopsy performed by Dr. Lucia Gaskins.        HISTORY OF PRESENTING ILLNESS (12/05/2016):  Crystal Logan 66 y.o. female who presented for routine screening mammogram on 10/12/2016 and was found to have possible right breast asymmetry. Accordingly the patient underwent diagnostic mammogram and right breast ultrasound on 11/16/2016. These scans showed persistent asymmetry within the inner right breast, 3 o'clock axis region, at middle depth, with associated architectural distortion. Biopsy of the right breast on 11/27/2016 revealed invasive mammary carcinoma, grade 1-2 / ER 90% / PR 40% / HER-2 negative / Ki67 5%.   The patient's case was presented to our multidisciplinary tumor board and she is seen in breast clinic today. She is here for further evaluation and treatment recommendations.   [name] is doing well overall. Our visit was facilitated by the patient's husband who translated throughout the visit. She has never had surgery before and is nervous about the procedure. She did not self palpate the breast mass. This was her first screening mammogram. She injured her breast three years ago and  had intermittent pain in the breast since. This pain was worse with change in the weather. She reports a palpable lump post-biopsy. She denies pain at the biopsy site. She denies joint pain. She has no other major health issues. She has no family history of cancer. She smokes about 6-7 cigarettes daily. She has  been smoking since 66 years-old. She did not experience hot flashes with menopause. She has not had a bone density scan.  GYN HISTORY  Menarchal: 28 or 66 years-old LMP: Around 66 years-old Contraceptive: None HRT: None GP: 1  CURRENT THERAPY: Pending surgical re-excision, adjuvant RT, and adjuvant anti-estrogen therapy  INTERVAL HISTORY: Crystal Logan returns to the clinic today for routine follow up. She is accompanied by her husband, who translates for her. The patient reports her surgery "went badly," and she is not happy that she will need to undergo re-excision. The patient experienced significant pain and dizziness for 2 days following surgery, and then improved. The patient and her husband question the results of her initial surgery.   MEDICAL HISTORY:  Past Medical History:  Diagnosis Date  . Breast cancer ([place]) 12/2016   right  . Hypertension    states under control with med., has been on med. ~ 15 yr.    SURGICAL HISTORY: Past Surgical History:  Procedure Laterality Date  . BREAST LUMPECTOMY WITH RADIOACTIVE SEED AND SENTINEL LYMPH NODE BIOPSY Right 01/02/2017   Procedure: RIGHT BREAST LUMPECTOMY WITH RADIOACTIVE SEED AND RIGHT AXILLARY SENTINEL LYMPH NODE BIOPSY;  Surgeon: Alphonsa Overall, MD;  Location: [facility];  Service: General;  Laterality: Right;  . TONSILLECTOMY      SOCIAL HISTORY: Social History   Social History  . Marital status: Married    Spouse name: N/A  . Number of children: N/A  . Years of education: N/A   Occupational History  . Not on file.   Social History Main Topics  . Smoking status: Current Every Day Smoker    Packs/day: 0.25    Years: 47.00    Types: Cigarettes  . Smokeless tobacco: Never Used  . Alcohol use Yes     Comment: occasionally  . Drug use: No  . Sexual activity: Not on file   Other Topics Concern  . Not on file   Social History Narrative  . No narrative on file    FAMILY HISTORY: History reviewed. No  pertinent family history.  ALLERGIES:  has No Known Allergies.  MEDICATIONS:  Current Outpatient Prescriptions  Medication Sig Dispense Refill  . HYDROcodone-acetaminophen (NORCO/VICODIN) 5-325 MG tablet Take 1-2 tablets by mouth every 6 (six) hours as needed for moderate pain. 30 tablet 0  . olmesartan-hydrochlorothiazide (BENICAR HCT) 20-12.5 MG tablet Take 1 tablet by mouth daily.     No current facility-administered medications for this visit.     REVIEW OF SYSTEMS:   Constitutional: Denies fevers, chills or abnormal night sweats Eyes: Denies blurriness of vision, double vision or watery eyes Ears, nose, mouth, throat, and face: Denies mucositis or sore throat Respiratory: Denies cough, dyspnea or wheezes Cardiovascular: Denies palpitation, chest discomfort or lower extremity swelling Gastrointestinal:  Denies nausea, heartburn or change in bowel habits Skin: Denies abnormal skin rashes (+) pain to incision site Lymphatics: Denies new lymphadenopathy or easy bruising Neurological:Denies numbness, tingling or new weaknesses Behavioral/Psych: Mood is stable, no new changes  All other systems were reviewed with the patient and are negative.  PHYSICAL EXAMINATION: ECOG PERFORMANCE STATUS: 0 - Asymptomatic  Vitals:   01/10/17  1310  BP: (!) 143/65  Pulse: 80  Temp: 97.9 F (36.6 C)   Filed Weights   01/10/17 1310  Weight: 206 lb 8 oz (93.7 kg)    GENERAL:alert, no distress and comfortable SKIN: skin color, texture, turgor are normal, no rashes or significant lesions EYES: normal, conjunctiva are pink and non-injected, sclera clear OROPHARYNX:no exudate, no erythema and lips, buccal mucosa, and tongue normal  NECK: supple, thyroid normal size, non-tender, without nodularity LYMPH:  no palpable lymphadenopathy in the cervical, axillary or inguinal LUNGS: clear to auscultation and percussion with normal breathing effort HEART: regular rate & rhythm and no murmurs and no  lower extremity edema ABDOMEN:abdomen soft, non-tender and normal bowel sounds Musculoskeletal:no cyanosis of digits and no clubbing  PSYCH: alert & oriented x 3 with fluent speech NEURO: no focal motor/sensory deficits BREAST: Right breast surgical incision is well healed with no sign of infection.   LABORATORY DATA:  I have reviewed the data as listed CBC Latest Ref Rng & Units 01/10/2017 12/05/2016  WBC 3.9 - 10.3 10e3/uL 8.7 5.6  Hemoglobin 11.6 - 15.9 g/dL 13.2 13.8  Hematocrit 34.8 - 46.6 % 39.6 40.6  Platelets 145 - 400 10e3/uL 284 283    CMP Latest Ref Rng & Units 01/10/2017 12/05/2016  Glucose 70 - 140 mg/dl 104 115  BUN 7.0 - 26.0 mg/dL 12.0 13.4  Creatinine 0.6 - 1.1 mg/dL 0.8 0.8  Sodium 136 - 145 mEq/L 139 139  Potassium 3.5 - 5.1 mEq/L 4.2 4.7  CO2 22 - 29 mEq/L 26 25  Calcium 8.4 - 10.4 mg/dL 9.6 9.5  Total Protein 6.4 - 8.3 g/dL 7.2 7.5  Total Bilirubin 0.20 - 1.20 mg/dL 0.38 0.38  Alkaline Phos 40 - 150 U/L 89 85  AST 5 - 34 U/L 12 15  ALT 0 - 55 U/L 28 19    PATHOLOGY  Diagnosis 01/02/17 1. Breast, lumpectomy, Right - INVASIVE LOBULAR CARCINOMA, 2.8 CM. - INVASIVE CARCINOMA BROADLY INVOLVES INFERIOR MARGIN. - PREVIOUS BIOPSY SITE. - FOCAL LYMPH VASCULAR INVOLVEMENT BY TUMOR. 2. Lymph node, sentinel, biopsy, Right Axillary - METASTATIC CARCINOMA IN ONE LYMPH NODE WITH EXTRANODAL EXTENSION (1/1). Microscopic Comment 1. BREAST, INVASIVE TUMOR Procedure: Localized lumpectomy and one sentinel lymph node. Laterality: Right breast. Tumor Size: 2.8 cm Histologic Type: Lobular Grade: I Tubular Differentiation: 3 Nuclear Pleomorphism: 1 Mitotic Count: 1 Ductal Carcinoma in Situ (DCIS): No Extent of Tumor: Skin: N/A Nipple: N/A Skeletal muscle: N/A Margins: Invasive carcinoma, distance from closest margin: Inferior margin broadly involved by invasive carcinoma. DCIS, distance from closest margin: N/A Regional Lymph Nodes: Number of Lymph Nodes Examined:  1 Number of Sentinel Lymph Nodes Examined: 1 Lymph Nodes with Macrometastases: 1 Lymph Nodes with Micrometastases: 0 Lymph Nodes with Isolated Tumor Cells: 0 1 of 3 FINAL for Logan, Kerrie Pleasure ([id number]) Microscopic Comment(continued) Breast Prognostic Profile: Case # [id number] Estrogen Receptor: 90%, positive, strong staining Progesterone Receptor: 40%, positive, strong staining Her2: Negative, ratio 1.31 Ki-67: 5% Best tumor block for sendout testing: 1J Pathologic Stage Classification (pTNM, AJCC 8th Edition): Primary Tumor (pT): pT2 Regional Lymph Nodes (pN): pN1a Distant Metastases (pM): pMX (JDP:ecj 01/03/2017)  Diagnosis 12/26/16 Surgical [P], descending, polyps (3) -TUBULAR ADENOMAS. -NO HIGH GRADE DYSPLASIA OR MALIGNANCY IDENTIFIED.  Diagnosis 11/27/2016 Breast, right, needle core biopsy, 3:00 o'clock - INVASIVE MAMMARY CARCINOMA. - SEE COMMENT. Microscopic Comment The carcinoma appears grade 1-2. An E-Cadherin stain and a breast prognostic profile will be performed and the results reported separately. The results were called  to [facility] on 11/28/16. (JBK:gt, 11/28/16) The malignant cells are negative for E-Cadherin, supporting a lobular phenotype. (JBK:kh 11/29/16) PROGNOSTIC INDICATORS Results: IMMUNOHISTOCHEMICAL AND MORPHOMETRIC ANALYSIS PERFORMED MANUALLY Estrogen Receptor: 90%, POSITIVE, STRONG STAINING INTENSITY Progesterone Receptor: 40%, POSITIVE, STRONG STAINING INTENSITY Proliferation Marker Ki67: 5% FLUORESCENCE IN-SITU HYBRIDIZATION Results: HER2 - NEGATIVE RATIO OF HER2/CEP17 SIGNALS 1.31 AVERAGE HER2 COPY NUMBER PER CELL 2.75   PROCEDURES Colonoscopy 12/26/16 - Three 3 to 6 mm polyps in the descending colon, removed with a cold snare. Resected and retrieved. - The examination was otherwise normal on direct and retroflexion views.  RADIOGRAPHIC STUDIES: I have personally reviewed the radiological images as listed and agreed with  the findings in the report. Mm Breast Surgical Specimen  Result Date: 01/02/2017 CLINICAL DATA:  Status post excision of following radioactive seed localization for right breast cancer. EXAM: SPECIMEN RADIOGRAPH OF THE RIGHT BREAST COMPARISON:  Previous exam(s). FINDINGS: Status post excision of the right breast. The radioactive seed and biopsy marker clip are present, completely intact, and were marked for pathology. IMPRESSION: Specimen radiograph of the right breast. Electronically Signed   By: Nolon Nations M.D.   On: 01/02/2017 09:46   Mm Rt Radioactive Seed Loc Mammo Guide  Result Date: 01/01/2017 CLINICAL DATA:  Patient with right breast cancer scheduled for breast conservation surgery requiring preoperative radioactive seed localization. EXAM: MAMMOGRAPHIC GUIDED RADIOACTIVE SEED LOCALIZATION OF THE RIGHT BREAST COMPARISON:  Previous exam(s). FINDINGS: Patient presents for radioactive seed localization prior to breast conservation surgery. I met with the patient and we discussed the procedure of seed localization including benefits and alternatives. We discussed the high likelihood of a successful procedure. We discussed the risks of the procedure including infection, bleeding, tissue injury and further surgery. We discussed the low dose of radioactivity involved in the procedure. Informed, written consent was given. The usual time-out protocol was performed immediately prior to the procedure. Using mammographic guidance, sterile technique, 1% lidocaine and an I-125 radioactive seed, the mass within the inner right breast, approximately 1 cm inferior to the coil shaped clip, was localized using a medial approach. The follow-up mammogram images confirm the seed in the expected location and were marked for Dr. Lucia Gaskins. Follow-up survey of the patient confirms presence of the radioactive seed. Order number of I-125 seed:  2018 x 3417. Total activity:  6.644 millicuries  Reference Date: 12/11/2016 The  patient tolerated the procedure well and was released from the [facility]. She was given instructions regarding seed removal. IMPRESSION: Radioactive seed localization right breast. Radioactive seed is well-positioned within the mass, inner right breast, approximately 1 cm inferior to the coil shaped clip. No apparent complications. These results were called by telephone at the time of interpretation on 01/01/2017 at 2:30 pm to Dr. Alphonsa Overall. Electronically Signed   By: Franki Cabot M.D.   On: 01/01/2017 14:34    ASSESSMENT & PLAN:  66 y.o. post-menopausal female with   1. 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 -Giving the positive inferior surgical margin, she would need repeat physician, she is agreeable. She will schedule the second surgery with Dr. Pollie Friar office. -Giving the positive lymph nodes, I recommend mammaprint to see if she has high risk versus low risk disease, to see if she would benefit from adjuvant chemotherapy. However patient declined chemotherapy,  in any circumstance. She is not interested in knowing if she has high risk versus low risk disease. I'll not send mammaprint. -Giving her lobular histology, low grade, strongly ER and PR positive, HER-2 negative disease, my clinical suspicion this is low risk disease. -Giving the strong ER and PR expression in her postmenopausal status, I recommend adjuvant aromatase inhibitor for a total of 5-10 years to reduce the risk of cancer recurrence. Potential benefits and side effects were discussed with patient and she is interested. I plan to start her on letrozole after she completes adjuvant irradiation. -She was also seen by radiation oncologist Dr. Isidore Moos previously. Adjuvant breast radiation or discussed and the recommended. -We previously  discussed the breast cancer surveillance after her surgery. She will continue annual screening mammogram, self exam, and a routine office visit with lab and exam with [place]. -I encouraged her to have healthy diet and exercise regularly.  -She has not had a bone density scan. She will undergo one sometime in the next year.  -I'll plan to see her back after she completes radiation.  2. Tobacco use -She currently smokes 6-7 cigarettes daily. She has been smoking since 66 years-old. She previously smoked 1 ppd but has cut down. -We previously discussed smoking cessation and will continue to address this at future visits.  3. HTN -She'll continue medication and follow-up with her primary care physician  Plan: - Patient declined adjuvant chemotherapy  -Patient will proceed with surgical re-excision and followed by adjuvant RT as directed. - I will see the patient in follow up following the completion of radiation therapy.   Orders Placed This Encounter  Procedures  . CBC with Differential    Standing Status:   Standing    Number of Occurrences:   30    Standing Expiration Date:   01/10/2022  . Comprehensive metabolic panel    Standing Status:   Standing    Number of Occurrences:   30    Standing Expiration Date:   01/10/2022    All questions were answered. The patient knows to call the clinic with any problems, questions or concerns.  I spent 25 minutes counseling the patient face to face. The total time spent in the appointment was 30 minutes and more than 50% was on counseling.  This document serves as a record of services personally performed by Truitt Merle, MD. It was created on her behalf by Maryla Morrow, a trained medical scribe. The creation of this record is based on the scribe's personal observations and the provider's statements to them. This document has been checked and approved by the attending provider.Truitt Merle, MD 01/10/2017

## 2017-01-10 ENCOUNTER — Encounter: Payer: Self-pay | Admitting: Hematology

## 2017-01-10 ENCOUNTER — Other Ambulatory Visit (HOSPITAL_BASED_OUTPATIENT_CLINIC_OR_DEPARTMENT_OTHER): Payer: Medicare Other

## 2017-01-10 ENCOUNTER — Ambulatory Visit (HOSPITAL_BASED_OUTPATIENT_CLINIC_OR_DEPARTMENT_OTHER): Payer: Medicare Other | Admitting: Hematology

## 2017-01-10 VITALS — BP 143/65 | HR 80 | Temp 97.9°F | Ht 64.0 in | Wt 206.5 lb

## 2017-01-10 DIAGNOSIS — C50211 Malignant neoplasm of upper-inner quadrant of right female breast: Secondary | ICD-10-CM

## 2017-01-10 DIAGNOSIS — Z720 Tobacco use: Secondary | ICD-10-CM | POA: Diagnosis not present

## 2017-01-10 DIAGNOSIS — I10 Essential (primary) hypertension: Secondary | ICD-10-CM

## 2017-01-10 DIAGNOSIS — Z170 Estrogen receptor positive status [ER+]: Secondary | ICD-10-CM | POA: Diagnosis not present

## 2017-01-10 LAB — COMPREHENSIVE METABOLIC PANEL
ALT: 28 U/L (ref 0–55)
AST: 12 U/L (ref 5–34)
Albumin: 3.9 g/dL (ref 3.5–5.0)
Alkaline Phosphatase: 89 U/L (ref 40–150)
Anion Gap: 7 mEq/L (ref 3–11)
BUN: 12 mg/dL (ref 7.0–26.0)
CALCIUM: 9.6 mg/dL (ref 8.4–10.4)
CHLORIDE: 106 meq/L (ref 98–109)
CO2: 26 mEq/L (ref 22–29)
Creatinine: 0.8 mg/dL (ref 0.6–1.1)
EGFR: 78 mL/min/{1.73_m2} — ABNORMAL LOW (ref >90)
GLUCOSE: 104 mg/dL (ref 70–140)
POTASSIUM: 4.2 meq/L (ref 3.5–5.1)
Sodium: 139 mEq/L (ref 136–145)
Total Bilirubin: 0.38 mg/dL (ref 0.20–1.20)
Total Protein: 7.2 g/dL (ref 6.4–8.3)

## 2017-01-10 LAB — CBC WITH DIFFERENTIAL/PLATELET
BASO%: 0.8 % (ref 0.0–2.0)
BASOS ABS: 0.1 10*3/uL (ref 0.0–0.1)
EOS%: 2.1 % (ref 0.0–7.0)
Eosinophils Absolute: 0.2 10*3/uL (ref 0.0–0.5)
HEMATOCRIT: 39.6 % (ref 34.8–46.6)
HEMOGLOBIN: 13.2 g/dL (ref 11.6–15.9)
LYMPH#: 3.2 10*3/uL (ref 0.9–3.3)
LYMPH%: 36.7 % (ref 14.0–49.7)
MCH: 30.6 pg (ref 25.1–34.0)
MCHC: 33.3 g/dL (ref 31.5–36.0)
MCV: 91.9 fL (ref 79.5–101.0)
MONO#: 0.7 10*3/uL (ref 0.1–0.9)
MONO%: 8.4 % (ref 0.0–14.0)
NEUT#: 4.5 10*3/uL (ref 1.5–6.5)
NEUT%: 52 % (ref 38.4–76.8)
Platelets: 284 10*3/uL (ref 145–400)
RBC: 4.31 10*6/uL (ref 3.70–5.45)
RDW: 13.2 % (ref 11.2–14.5)
WBC: 8.7 10*3/uL (ref 3.9–10.3)

## 2017-01-15 ENCOUNTER — Other Ambulatory Visit: Payer: Self-pay | Admitting: Surgery

## 2017-01-15 ENCOUNTER — Telehealth: Payer: Self-pay | Admitting: Hematology

## 2017-01-15 NOTE — Telephone Encounter (Signed)
No LOS per 01/10/17 visit.

## 2017-01-17 ENCOUNTER — Encounter (HOSPITAL_COMMUNITY): Payer: Self-pay | Admitting: *Deleted

## 2017-01-17 NOTE — Progress Notes (Addendum)
I called the patient's listed phone number and her husband answered. Mr Dory Peru reports that he always interpreters for patient and we do not need an interpreter.  Mr Dory Peru relayed frustration with the questions, "she just had surgery".  I explained to Mr Dory Peru that we review history for safety for patient. Mr Dory Peru answered a couple more questions and then said he was driving and he needed to focus on it, Mr Dory Peru had not reported earlier that the was driving.  I asked Mr Dory Peru when would be a good time to call him back, "I talk to you tomorrow, when she comes for surgery at 1030, I asked that she arrive at 10:00 AM."  Mr Dory Peru said , "ok" and phone went dead.  I called Dr Jacqulynn Cadet office and spoke with Collie Siad to she if patient has a  Recent EKG.  Collie Siad siad she will check patient's chjart and send it to me if she does.

## 2017-01-17 NOTE — H&P (Signed)
Crystal Logan  Location: [facility] Patient #: [id number] DOB: February 15, 1951 Married / Language: Other / Race: Refused to Report/Unreported Female   History of Present Illness   The patient is a 66 year old female who presents with a complaint of right breast cancer.   The PCP is Dr. Chelsea Logan  The patient was referred by Dr. Chelsea Logan  The pateint is at the Breast [facility] - Oncology is Drs. Crystal Logan and Crystal Logan She comes with her husband, Crystal Logan.  I did a right lumpectomy and right axillary SLNBx on 01/02/2017. Her pathology showed a 2.8 cm right breast ILC, positive inferior margin, 1/1 nodes positive. She has done well from surgery. Unfortunately, with a positive margin she'll need a reexcision.  Dr. Burr Logan has ordered a Mammoprint on the specimen. If this is high, she will need chemotherapy. I discussed the indications and potential complications of the power port placement. The primary complications of the power port, include, but are not limited to, bleeding, infection, nerve injury, thrombosis, and pneumothorax. I plan to talk to Crystal Logan after the mammoprint.  History of breast cancer: She had never had a mammogram before. she did not feel anything in her breast. She has no family history of breast cancer. Crystal Logan and her were married in 2013, and she moved to the [place] in the same year. Crystal Logan has been in the [place] since 1984. He was Teacher, early years/pre in [place] prior to moving her. She wants to wait for any surgery (or treatment) until her daughter comes to the [place] this summer, but I pushed her to think about doing surgery earlier. Mammograms: At the [facility] on 11/16/2016 showed a distortion at the 3 o'clock position of the right breast. There was not size noted. (Note on biopsy - clip is 1.5 cm superior to bx) Biopsy: Right breast biospy on 11/27/2016 ([id number]) showed ILC, ER - 90%, PR - 40%, ki67 - 5%, and Her2Neu -  neg. Family history of breast or ovarian cancer: None On hormone therapy: None She had an MRI on 12/08/2016 that shows a 3.0 x 2.1 cm central mass (under the areola) on MRI. There is no other mass.  Past Medical History: 1. HTN x years 2. Smokes about 1/2 ppd - her husband is pushing her hard to quit  Social History: Married to [employer]. He speaks [place] fairly well. I think he understands the options. She does not speak [place]. I think that their back ground is [place]. Daughter, Crystal Logan, lives in [place] and is coming to visit in June.   Allergies (Crystal Logan, CMA; 01/09/2017 11:38 AM) No Known Drug Allergies 12/13/2016 Allergies Reconciled   Medication History (Crystal Logan, CMA; 01/09/2017 11:39 AM) Benicar HCT (20-12.5MG Tablet, Oral) Active. Medications Reconciled  Vitals (Crystal Logan CMA; 01/09/2017 11:40 AM) 01/09/2017 11:39 AM Weight: 204.13 lb Height: 64in Body Surface Area: 1.97 m Body Mass Index: 35.04 kg/m  Temp.: 98.68F(Oral)  Pulse: 83 (Regular)  BP: 142/80 (Sitting, Left Arm, Standard)   Physical Exam  General: WN WFalert and generally healthy appearing. HEENT: Normal. Pupils equal.  Neck: Supple. No mass. No thyroid mass.  Lymph Nodes: No supraclavicular, cervica, or axillary nodes. Right axillary incision looks good.  Lungs: Clear to auscultation and symmetric breath sounds. Heart: RRR. No murmur or rub.  Breasts: Right - Incision looks good - the incision is circumareolar and goes radially at 3 o'clock. Left - No mass or nodule  Abdomen: Soft. No mass. No tenderness. No  hernia. Normal bowel sounds. No abdominal scars.  Extremities: Good strength and ROM in upper and lower extremities.   Assessment & Plan  1.  BREAST CANCER, STAGE 2, RIGHT (C50.911)  Story: I did a right lumpectomy and right axillary SLNBx on 01/02/2017. Her pathology showed a 2.8 cm right breast ILC, positive  inferior marging, 1/1 nodes positive.   Oncology - Crystal Logan and [place]  Plan:   1) she needs a re-excision of her inferior margin   2)     Addendum Note(Crystal Logan H. Lucia Gaskins MD; 01/15/2017 4:38 PM)  She saw Dr. Burr Logan - and has refused chemotx. So they did not send off a mammoprint.  I talked to the husband about re-excising the right breast lumpectomy. He has a birthday on 5/20 and they are having a lot of family come, so he wants to work around this - which complicates the timing.  Will schedule.  2. HTN x years 3. Smokes about 1/2 ppd - her husband is pushing her hard to [place], MD, [facility] Surgery Pager: [phone] Office phone:  [phone]

## 2017-01-18 ENCOUNTER — Encounter (HOSPITAL_COMMUNITY): Payer: Self-pay | Admitting: Certified Registered Nurse Anesthetist

## 2017-01-18 ENCOUNTER — Encounter (HOSPITAL_COMMUNITY)
Admission: RE | Disposition: A | Discharge status: Home / Self Care | Payer: Self-pay | Source: Ambulatory Visit | Attending: Surgery

## 2017-01-18 ENCOUNTER — Ambulatory Visit (HOSPITAL_COMMUNITY): Payer: Medicare Other | Admitting: Certified Registered Nurse Anesthetist

## 2017-01-18 ENCOUNTER — Ambulatory Visit (HOSPITAL_COMMUNITY)
Admission: RE | Admit: 2017-01-18 | Discharge: 2017-01-18 | Disposition: A | Discharge status: Home / Self Care | Payer: Medicare Other | Source: Ambulatory Visit | Attending: Surgery | Admitting: Surgery

## 2017-01-18 DIAGNOSIS — C50911 Malignant neoplasm of unspecified site of right female breast: Secondary | ICD-10-CM | POA: Insufficient documentation

## 2017-01-18 DIAGNOSIS — F17210 Nicotine dependence, cigarettes, uncomplicated: Secondary | ICD-10-CM | POA: Diagnosis not present

## 2017-01-18 DIAGNOSIS — I10 Essential (primary) hypertension: Secondary | ICD-10-CM | POA: Insufficient documentation

## 2017-01-18 HISTORY — PX: RE-EXCISION OF BREAST LUMPECTOMY: SHX6048

## 2017-01-18 LAB — CBC
HCT: 37.4 % (ref 36.0–46.0)
Hemoglobin: 12.6 g/dL (ref 12.0–15.0)
MCH: 30.8 pg (ref 26.0–34.0)
MCHC: 33.7 g/dL (ref 30.0–36.0)
MCV: 91.4 fL (ref 78.0–100.0)
Platelets: 259 10*3/uL (ref 150–400)
RBC: 4.09 MIL/uL (ref 3.87–5.11)
RDW: 13.2 % (ref 11.5–15.5)
WBC: 6.3 10*3/uL (ref 4.0–10.5)

## 2017-01-18 LAB — BASIC METABOLIC PANEL
Anion gap: 9 (ref 5–15)
BUN: 12 mg/dL (ref 6–20)
CALCIUM: 8.8 mg/dL — AB (ref 8.9–10.3)
CO2: 22 mmol/L (ref 22–32)
CREATININE: 0.66 mg/dL (ref 0.44–1.00)
Chloride: 107 mmol/L (ref 101–111)
GFR calc Af Amer: >60 mL/min (ref >60)
GFR calc non Af Amer: >60 mL/min (ref >60)
Glucose, Bld: 102 mg/dL — ABNORMAL HIGH (ref 65–99)
Potassium: 4 mmol/L (ref 3.5–5.1)
SODIUM: 138 mmol/L (ref 135–145)

## 2017-01-18 SURGERY — EXCISION, LESION, BREAST
Anesthesia: General | Site: Breast | Laterality: Right

## 2017-01-18 MED ORDER — FENTANYL CITRATE (PF) 250 MCG/5ML IJ SOLN
INTRAMUSCULAR | Status: AC
  Filled 2017-01-18: qty 5

## 2017-01-18 MED ORDER — PROMETHAZINE HCL 25 MG/ML IJ SOLN: 6.2500 mg | INTRAMUSCULAR | Status: DC | PRN

## 2017-01-18 MED ORDER — EPHEDRINE SULFATE 50 MG/ML IJ SOLN
INTRAMUSCULAR | Status: DC | PRN
Start: 2017-01-18 — End: 2017-01-18
  Administered 2017-01-18 (×2): 10 mg via INTRAVENOUS

## 2017-01-18 MED ORDER — PROPOFOL 10 MG/ML IV BOLUS
INTRAVENOUS | Status: AC
  Filled 2017-01-18: qty 20

## 2017-01-18 MED ORDER — ACETAMINOPHEN 500 MG PO TABS
ORAL_TABLET | ORAL | Status: AC
  Filled 2017-01-18: qty 2

## 2017-01-18 MED ORDER — MIDAZOLAM HCL 2 MG/2ML IJ SOLN
INTRAMUSCULAR | Status: AC
  Filled 2017-01-18: qty 2

## 2017-01-18 MED ORDER — LIDOCAINE 2% (20 MG/ML) 5 ML SYRINGE
INTRAMUSCULAR | Status: AC
  Filled 2017-01-18: qty 5

## 2017-01-18 MED ORDER — CEFAZOLIN SODIUM-DEXTROSE 2-4 GM/100ML-% IV SOLN
INTRAVENOUS | Status: AC
  Filled 2017-01-18: qty 100

## 2017-01-18 MED ORDER — PHENYLEPHRINE HCL 10 MG/ML IJ SOLN
INTRAMUSCULAR | Status: DC | PRN
  Administered 2017-01-18: 80 ug via INTRAVENOUS

## 2017-01-18 MED ORDER — 0.9 % SODIUM CHLORIDE (POUR BTL) OPTIME
TOPICAL | Status: DC | PRN
  Administered 2017-01-18: 1000 mL

## 2017-01-18 MED ORDER — DEXAMETHASONE SODIUM PHOSPHATE 10 MG/ML IJ SOLN
INTRAMUSCULAR | Status: AC
  Filled 2017-01-18: qty 1

## 2017-01-18 MED ORDER — HYDROCODONE-ACETAMINOPHEN 5-325 MG PO TABS
1.0000 | ORAL_TABLET | Freq: Four times a day (QID) | ORAL | 0 refills | Status: DC | PRN
Start: 2017-01-18 — End: 2018-10-08

## 2017-01-18 MED ORDER — CHLORHEXIDINE GLUCONATE CLOTH 2 % EX PADS: 6.0000 | MEDICATED_PAD | Freq: Once | CUTANEOUS | Status: DC

## 2017-01-18 MED ORDER — FENTANYL CITRATE (PF) 100 MCG/2ML IJ SOLN
INTRAMUSCULAR | Status: DC | PRN
  Administered 2017-01-18: 50 ug via INTRAVENOUS
  Administered 2017-01-18: 150 ug via INTRAVENOUS

## 2017-01-18 MED ORDER — BUPIVACAINE-EPINEPHRINE 0.25% -1:200000 IJ SOLN
INTRAMUSCULAR | Status: DC | PRN
  Administered 2017-01-18: 30 mL

## 2017-01-18 MED ORDER — LACTATED RINGERS IV SOLN
INTRAVENOUS | Status: DC
  Administered 2017-01-18 (×2): via INTRAVENOUS

## 2017-01-18 MED ORDER — CEFAZOLIN SODIUM-DEXTROSE 2-4 GM/100ML-% IV SOLN
2.0000 g | INTRAVENOUS | Status: AC
  Administered 2017-01-18: 2 g via INTRAVENOUS

## 2017-01-18 MED ORDER — DEXAMETHASONE SODIUM PHOSPHATE 10 MG/ML IJ SOLN
INTRAMUSCULAR | Status: DC | PRN
  Administered 2017-01-18: 10 mg via INTRAVENOUS

## 2017-01-18 MED ORDER — PROPOFOL 10 MG/ML IV BOLUS
INTRAVENOUS | Status: DC | PRN
  Administered 2017-01-18: 150 mg via INTRAVENOUS

## 2017-01-18 MED ORDER — HYDROMORPHONE HCL 1 MG/ML IJ SOLN
INTRAMUSCULAR | Status: AC
  Filled 2017-01-18: qty 0.5

## 2017-01-18 MED ORDER — HYDROMORPHONE HCL 1 MG/ML IJ SOLN: 0.2500 mg | INTRAMUSCULAR | Status: DC | PRN

## 2017-01-18 MED ORDER — GABAPENTIN 300 MG PO CAPS
300.0000 mg | ORAL_CAPSULE | ORAL | Status: AC
  Administered 2017-01-18: 300 mg via ORAL

## 2017-01-18 MED ORDER — MIDAZOLAM HCL 5 MG/5ML IJ SOLN
INTRAMUSCULAR | Status: DC | PRN
  Administered 2017-01-18: 2 mg via INTRAVENOUS

## 2017-01-18 MED ORDER — OXYCODONE HCL 5 MG/5ML PO SOLN: 5.0000 mg | Freq: Once | ORAL | Status: DC | PRN

## 2017-01-18 MED ORDER — LIDOCAINE HCL (CARDIAC) 20 MG/ML IV SOLN
INTRAVENOUS | Status: DC | PRN
  Administered 2017-01-18: 100 mg via INTRAVENOUS

## 2017-01-18 MED ORDER — GABAPENTIN 300 MG PO CAPS
ORAL_CAPSULE | ORAL | Status: AC
  Administered 2017-01-18: 300 mg via ORAL
  Filled 2017-01-18: qty 1

## 2017-01-18 MED ORDER — ACETAMINOPHEN 10 MG/ML IV SOLN
1000.0000 mg | Freq: Four times a day (QID) | INTRAVENOUS | Status: DC
  Administered 2017-01-18: 1000 mg via INTRAVENOUS

## 2017-01-18 MED ORDER — ROCURONIUM BROMIDE 10 MG/ML (PF) SYRINGE
PREFILLED_SYRINGE | INTRAVENOUS | Status: AC
  Filled 2017-01-18: qty 5

## 2017-01-18 MED ORDER — MEPERIDINE HCL 25 MG/ML IJ SOLN: 6.2500 mg | INTRAMUSCULAR | Status: DC | PRN

## 2017-01-18 MED ORDER — ONDANSETRON HCL 4 MG/2ML IJ SOLN
INTRAMUSCULAR | Status: AC
  Filled 2017-01-18: qty 2

## 2017-01-18 MED ORDER — ONDANSETRON HCL 4 MG/2ML IJ SOLN
INTRAMUSCULAR | Status: DC | PRN
  Administered 2017-01-18: 4 mg via INTRAVENOUS

## 2017-01-18 MED ORDER — BUPIVACAINE HCL (PF) 0.25 % IJ SOLN
INTRAMUSCULAR | Status: AC
  Filled 2017-01-18: qty 30

## 2017-01-18 MED ORDER — ACETAMINOPHEN 500 MG PO TABS: 1000.0000 mg | ORAL_TABLET | ORAL | Status: DC

## 2017-01-18 MED ORDER — ACETAMINOPHEN 10 MG/ML IV SOLN
INTRAVENOUS | Status: AC
  Administered 2017-01-18: 1000 mg via INTRAVENOUS
  Filled 2017-01-18: qty 100

## 2017-01-18 MED ORDER — OXYCODONE HCL 5 MG PO TABS: 5.0000 mg | ORAL_TABLET | Freq: Once | ORAL | Status: DC | PRN

## 2017-01-18 SURGICAL SUPPLY — 43 items
APPLIER CLIP 9.375 MED OPEN (MISCELLANEOUS)
BINDER BREAST LRG (GAUZE/BANDAGES/DRESSINGS) IMPLANT
BINDER BREAST XLRG (GAUZE/BANDAGES/DRESSINGS) ×3 IMPLANT
CANISTER SUCT 3000ML PPV (MISCELLANEOUS) ×3 IMPLANT
CHLORAPREP W/TINT 26ML (MISCELLANEOUS) ×3 IMPLANT
CLIP APPLIE 9.375 MED OPEN (MISCELLANEOUS) IMPLANT
CLIP TI WIDE RED SMALL 6 (CLIP) ×3 IMPLANT
COVER SURGICAL LIGHT HANDLE (MISCELLANEOUS) ×3 IMPLANT
DECANTER SPIKE VIAL GLASS SM (MISCELLANEOUS) ×3 IMPLANT
DERMABOND ADVANCED (GAUZE/BANDAGES/DRESSINGS) ×2
DERMABOND ADVANCED .7 DNX12 (GAUZE/BANDAGES/DRESSINGS) ×1 IMPLANT
DRAPE CHEST BREAST 15X10 FENES (DRAPES) ×3 IMPLANT
DRAPE UTILITY XL STRL (DRAPES) ×6 IMPLANT
DRSG PAD ABDOMINAL 8X10 ST (GAUZE/BANDAGES/DRESSINGS) ×3 IMPLANT
ELECT CAUTERY BLADE 6.4 (BLADE) IMPLANT
ELECT COATED BLADE 2.86 ST (ELECTRODE) ×3 IMPLANT
ELECT REM PT RETURN 9FT ADLT (ELECTROSURGICAL) ×3
ELECTRODE REM PT RTRN 9FT ADLT (ELECTROSURGICAL) ×1 IMPLANT
GAUZE SPONGE 4X4 12PLY STRL (GAUZE/BANDAGES/DRESSINGS) ×3 IMPLANT
GLOVE SURG SIGNA 7.5 PF LTX (GLOVE) ×3 IMPLANT
GOWN STRL REUS W/ TWL LRG LVL3 (GOWN DISPOSABLE) ×1 IMPLANT
GOWN STRL REUS W/ TWL XL LVL3 (GOWN DISPOSABLE) ×1 IMPLANT
GOWN STRL REUS W/TWL LRG LVL3 (GOWN DISPOSABLE) ×2
GOWN STRL REUS W/TWL XL LVL3 (GOWN DISPOSABLE) ×2
KIT BASIN OR (CUSTOM PROCEDURE TRAY) ×3 IMPLANT
KIT MARKER MARGIN INK (KITS) ×3 IMPLANT
KIT ROOM TURNOVER OR (KITS) ×3 IMPLANT
NEEDLE HYPO 25GX1X1/2 BEV (NEEDLE) ×6 IMPLANT
NS IRRIG 1000ML POUR BTL (IV SOLUTION) ×3 IMPLANT
PACK SURGICAL SETUP 50X90 (CUSTOM PROCEDURE TRAY) ×3 IMPLANT
PAD ARMBOARD 7.5X6 YLW CONV (MISCELLANEOUS) ×3 IMPLANT
PENCIL BUTTON HOLSTER BLD 10FT (ELECTRODE) ×3 IMPLANT
SPONGE LAP 4X18 X RAY DECT (DISPOSABLE) ×3 IMPLANT
STAPLER VISISTAT 35W (STAPLE) ×3 IMPLANT
SUT MON AB 5-0 PS2 18 (SUTURE) ×3 IMPLANT
SUT VIC AB 3-0 SH 18 (SUTURE) ×3 IMPLANT
SYR BULB 3OZ (MISCELLANEOUS) ×3 IMPLANT
SYR CONTROL 10ML LL (SYRINGE) ×6 IMPLANT
TOWEL OR 17X24 6PK STRL BLUE (TOWEL DISPOSABLE) ×3 IMPLANT
TOWEL OR 17X26 10 PK STRL BLUE (TOWEL DISPOSABLE) ×3 IMPLANT
TUBE CONNECTING 12'X1/4 (SUCTIONS) ×1
TUBE CONNECTING 12X1/4 (SUCTIONS) ×2 IMPLANT
YANKAUER SUCT BULB TIP NO VENT (SUCTIONS) ×3 IMPLANT

## 2017-01-18 NOTE — Anesthesia Postprocedure Evaluation (Addendum)
Anesthesia Post Note  Patient: Crystal Logan  Procedure(s) Performed: Procedure(s) (LRB): RE-EXCISION OF BREAST LUMPECTOMY (Right)  Patient location during evaluation: PACU Anesthesia Type: General Level of consciousness: awake and sedated Pain management: pain level controlled Vital Signs Assessment: post-procedure vital signs reviewed and stable Respiratory status: spontaneous breathing Cardiovascular status: stable Postop Assessment: no signs of nausea or vomiting Anesthetic complications: no        Last Vitals:  Vitals:   01/18/17 1437 01/18/17 1445  BP: 138/77   Pulse: 88 84  Resp: 10 13  Temp: 36.2 C     Last Pain:  Vitals:   01/18/17 1437  TempSrc:   PainSc: 0-No pain   Pain Goal:                 Rin Gorton JR,JOHN Najeeb Uptain

## 2017-01-18 NOTE — Transfer of Care (Signed)
Immediate Anesthesia Transfer of Care Note  Patient: Crystal Logan  Procedure(s) Performed: Procedure(s): RE-EXCISION OF BREAST LUMPECTOMY (Right)  Patient Location: PACU  Anesthesia Type:General  Level of Consciousness: awake and alert   Airway & Oxygen Therapy: Patient Spontanous Breathing and Patient connected to nasal cannula oxygen  Post-op Assessment: Report given to RN and Post -op Vital signs reviewed and stable  Post vital signs: Reviewed and stable  Last Vitals:  Vitals:   01/18/17 1024 01/18/17 1437  BP: (!) 151/85 138/77  Pulse: 81 88  Resp: 18 10  Temp: 36.4 C 36.2 C    Last Pain:  Vitals:   01/18/17 1024  TempSrc: Oral         Complications: No apparent anesthesia complications

## 2017-01-18 NOTE — Interval H&P Note (Signed)
History and Physical Interval Note:  01/18/2017 1:15 PM  Crystal Logan  has presented today for surgery, with the diagnosis of RIGHT BREAST CANCER  The various methods of treatment have been discussed with the patient and family.  Husband at bedside.  After consideration of risks, benefits and other options for treatment, the patient has consented to  Procedure(s): RE-EXCISION OF BREAST LUMPECTOMY (Right) as a surgical intervention .  The patient's history has been reviewed, patient examined, no change in status, stable for surgery.  I have reviewed the patient's chart and labs.  Questions were answered to the patient's satisfaction.     Rush Salce H

## 2017-01-18 NOTE — Discharge Instructions (Signed)
[facility] - DISCHARGE INSTRUCTIONS TO PATIENT  Activity:  Lifting - No lifting more than 15 pounds for one week, then no limit  Wound Care:   Leave bandage for 2 days, then may remove bandage and shower  Diet:  As toleated  Follow up appointment:  Call Dr. Pollie Friar office [facility]) at [phone] for an appointment in 2 weeks  Medications and dosages:  Resume your home medications.  You have a prescription for:  Vicodin  Call Dr. Lucia Gaskins or his office  [phone]) if you have:  Temperature greater than 100.4,  Persistent nausea and vomiting,  Redness, tenderness, or signs of infection (pain, swelling, redness, odor or green/yellow discharge around the site),  Any other questions or concerns you may have after discharge.  In an emergency, call 911 or go to an Emergency Department at a nearby hospital.

## 2017-01-18 NOTE — Anesthesia Procedure Notes (Signed)
Procedure Name: LMA Insertion Date/Time: 01/18/2017 1:30 PM Performed by: Ollen Bowl Pre-anesthesia Checklist: Patient identified, Emergency Drugs available, Suction available, Patient being monitored and Timeout performed Patient Re-evaluated:Patient Re-evaluated prior to inductionOxygen Delivery Method: Circle system utilized and Simple face mask Preoxygenation: Pre-oxygenation with 100% oxygen Intubation Type: IV induction Ventilation: Mask ventilation without difficulty LMA: LMA inserted LMA Size: 4.0 Number of attempts: 1 Airway Equipment and Method: Patient positioned with wedge pillow Placement Confirmation: positive ETCO2 and breath sounds checked- equal and bilateral Tube secured with: Tape Dental Injury: Teeth and Oropharynx as per pre-operative assessment

## 2017-01-18 NOTE — Anesthesia Preprocedure Evaluation (Signed)
Anesthesia Evaluation  Patient identified by MRN, date of birth, ID band Patient awake    Reviewed: Allergy & Precautions, NPO status , Patient's Chart, lab work & pertinent test results  Airway Mallampati: II  TM Distance: >3 FB Neck ROM: Full    Dental  (+) Teeth Intact, Dental Advisory Given   Pulmonary Current Smoker,    breath sounds clear to auscultation       Cardiovascular hypertension, Pt. on medications  Rhythm:Regular Rate:Normal     Neuro/Psych negative neurological ROS  negative psych ROS   GI/Hepatic negative GI ROS, Neg liver ROS,   Endo/Other  negative endocrine ROS  Renal/GU negative Renal ROS  negative genitourinary   Musculoskeletal negative musculoskeletal ROS (+)   Abdominal   Peds negative pediatric ROS (+)  Hematology negative hematology ROS (+)   Anesthesia Other Findings Day of surgery medications reviewed with the patient.  Reproductive/Obstetrics negative OB ROS                             Anesthesia Physical  Anesthesia Plan  ASA: II  Anesthesia Plan: General   Post-op Pain Management:    Induction: Intravenous  Airway Management Planned: LMA  Additional Equipment:   Intra-op Plan:   Post-operative Plan: Extubation in OR  Informed Consent: I have reviewed the patients History and Physical, chart, labs and discussed the procedure including the risks, benefits and alternatives for the proposed anesthesia with the patient or authorized representative who has indicated his/her understanding and acceptance.   Dental advisory given  Plan Discussed with: CRNA  Anesthesia Plan Comments:         Anesthesia Quick Evaluation

## 2017-01-18 NOTE — Op Note (Signed)
01/18/2017  2:27 PM  PATIENT:  Crystal Logan, 66 y.o., female, MRN: [id number]  PREOP DIAGNOSIS:  RIGHT BREAST CANCER  POSTOP DIAGNOSIS:   Right breast cancer, inferior margin positive on prior right breast lumpectomy  PROCEDURE:   Procedure(s): RE-EXCISION OF BREAST LUMPECTOMY - inferior margin  SURGEON:   Alphonsa Overall, M.D.  ASSISTANT:   None  ANESTHESIA:   general  Anesthesiologist: Lyn Hollingshead, MD CRNA: Casimer Leek, Durwin Glaze, CRNA  General  EBL:  minimal  ml  BLOOD ADMINISTERED: none  DRAINS: none   LOCAL MEDICATIONS USED:   30 cc 1/4% marcaine  SPECIMEN:   Inferior margin of lumpectomy cavity - long suture medial and short suture anterior, painted x 2  COUNTS CORRECT:  YES  INDICATIONS FOR PROCEDURE:  Crystal Logan is a 66 y.o. (DOB: 06-Oct-1950) white female whose primary care physician is Jilda Panda, MD and comes for re-excision of right breast lumpectomy site because of positive margins on a prior right breast lumpectomy on 01/02/2017.   The indications and risks of the surgery were explained to the patient.  The risks include, but are not limited to, infection, bleeding, and nerve injury.  PROCEDURE:   The patient was taken to room #2 at the [facility] operating room. She was given a general anesthesia. Her right breast was prepped with ChloraPrep and sterilely draped.   A timeout was held and surgical checklist run.   I went through the old circumareolar incision and got into the biopsy cavity. She had approximately 30 mL of seroma in the lumpectomy cavity. I irrigated this with 300 mL of saline.   I then excised the inferior margin of the lumpectomy cavity about 1 cm thick. The excised margin/area was about 6 x 7 cm. I placed a long suture medially and short suture anteriorly for orientation. I then painted the specimen with a 2 color paint kit, which was red -  superior and blue - inferior.   I then irrigated the wound with saline. I infiltrated 30 mL of quarter  percent Marcaine. I then closed the deep layers of the breast with 3-0 Vicryl suture, the superficial layers with 3-0 Vicryl suture, and the skin with 4-0 Monocryl.   The patient tolerated the procedure well and was transferred to recovery room in good condition.  Her sponge and needle count were correct at the end of the case of dictation on [name].  Alphonsa Overall, MD, [facility] Pager: [phone] Office phone:  [phone]

## 2017-01-19 ENCOUNTER — Encounter (HOSPITAL_COMMUNITY): Payer: Self-pay | Admitting: Surgery

## 2017-01-23 ENCOUNTER — Encounter: Payer: Self-pay | Admitting: Radiation Oncology

## 2017-02-01 ENCOUNTER — Ambulatory Visit: Payer: Medicare Other

## 2017-02-01 ENCOUNTER — Ambulatory Visit: Payer: Medicare Other | Admitting: Radiation Oncology

## 2017-02-07 NOTE — Progress Notes (Signed)
Location of Breast Cancer: Right Breast  Histology per Pathology Report:  11/27/16 Diagnosis Breast, right, needle core biopsy, 3:00 o'clock - INVASIVE MAMMARY CARCINOMA.  Receptor Status: ER(90%), PR (40%), Her2-neu (NEG), Ki-(5%)  01/02/17 Diagnosis 1. Breast, lumpectomy, Right - INVASIVE LOBULAR CARCINOMA, 2.8 CM. - INVASIVE CARCINOMA BROADLY INVOLVES INFERIOR MARGIN. - PREVIOUS BIOPSY SITE. - FOCAL LYMPH VASCULAR INVOLVEMENT BY TUMOR. 2. Lymph node, sentinel, biopsy, Right Axillary - METASTATIC CARCINOMA IN ONE LYMPH NODE WITH EXTRANODAL EXTENSION  01/18/17 .Diagnosis Breast, excision, Right Inferior Margin - ATYPICAL DUCTAL HYPERPLASIA. - FIBROCYSTIC CHANGES WITH CALCIFICATIONS. - VASCULAR CALCIFICATIONS.  Did patient present with symptoms or was this found on screening mammography?: It was found on a screening mammogram. She had never had a mammogram before.   Past/Anticipated interventions by surgeon, if any: 01/02/17 PROCEDURE:   Procedure(s): RIGHT BREAST LUMPECTOMY WITH RADIOACTIVE SEED AND RIGHT AXILLARY SENTINEL LYMPH NODE BIOPSY, Injection of peri areolar area of breast with methylene blue (1.0 cc), deep sentinel lymph node biopsy SURGEON:   Alphonsa Overall, M.D.  01/17/17 PROCEDURE:   Procedure(s): RE-EXCISION OF BREAST LUMPECTOMY - inferior margin SURGEON:   Alphonsa Overall, M.D.  Past/Anticipated interventions by medical oncology, if any:  01/10/17 Dr. Burr Medico Plan: - Patient declined adjuvant chemotherapy  -Patient will proceed with surgical re-excision and followed by adjuvant RT as directed. - I will see the patient in follow up following the completion of radiation therapy.  Lymphedema issues, if any:   She denies. She has good arm mobility.  Pain issues, if any:  She denies  SAFETY ISSUES:  Prior radiation? No  Pacemaker/ICD? No  Possible current pregnancy? N/A  Is the patient on methotrexate? N/A  Current Complaints / other details:   They are  concerned about the timing of radiation. Her daughter is coming to visit on July 14th for 2 weeks, and they don't want her to receive radiation while she is here.   BP 130/85   Pulse 73   Temp 98.3 F (36.8 C)   Wt 205 lb 9.6 oz (93.3 kg)   SpO2 100% Comment: room air  BMI 35.29 kg/m    Wt Readings from Last 3 Encounters:  02/12/17 205 lb 9.6 oz (93.3 kg)  01/18/17 206 lb 8 oz (93.7 kg)  01/10/17 206 lb 8 oz (93.7 kg)      Render Marley, Stephani Police, RN 02/07/2017,2:48 PM

## 2017-02-08 NOTE — Addendum Note (Signed)
Addendum  created 02/08/17 1310 by Effie Berkshire, MD   Sign clinical note

## 2017-02-12 ENCOUNTER — Ambulatory Visit
Admission: RE | Admit: 2017-02-12 | Discharge: 2017-02-12 | Disposition: A | Discharge status: Home / Self Care | Payer: Medicare Other | Source: Ambulatory Visit | Attending: Radiation Oncology | Admitting: Radiation Oncology

## 2017-02-12 ENCOUNTER — Encounter: Payer: Self-pay | Admitting: Radiation Oncology

## 2017-02-12 DIAGNOSIS — Z79899 Other long term (current) drug therapy: Secondary | ICD-10-CM | POA: Diagnosis not present

## 2017-02-12 DIAGNOSIS — Z170 Estrogen receptor positive status [ER+]: Principal | ICD-10-CM

## 2017-02-12 DIAGNOSIS — Z510 Encounter for antineoplastic radiation therapy: Secondary | ICD-10-CM | POA: Diagnosis not present

## 2017-02-12 DIAGNOSIS — F17210 Nicotine dependence, cigarettes, uncomplicated: Secondary | ICD-10-CM | POA: Insufficient documentation

## 2017-02-12 DIAGNOSIS — C50211 Malignant neoplasm of upper-inner quadrant of right female breast: Secondary | ICD-10-CM | POA: Diagnosis not present

## 2017-02-12 NOTE — Progress Notes (Signed)
  Radiation Oncology         [phone] ________________________________  Name: Crystal Logan MRN: [id number]  Date: 02/12/2017  DOB: 1950-12-18  SIMULATION AND TREATMENT PLANNING NOTE    Outpatient  DIAGNOSIS:     ICD-9-CM ICD-10-CM   1. Malignant neoplasm of upper-inner quadrant of right breast in female, estrogen receptor positive ([place]) 174.2 C50.211    V86.0 Z17.0     NARRATIVE:  The patient was brought to the [place].  Identity was confirmed.  All relevant records and images related to the planned course of therapy were reviewed.  The patient freely provided informed written consent to proceed with treatment after reviewing the details related to the planned course of therapy. The consent form was witnessed and verified by the simulation staff.    Then, the patient was set-up in a stable reproducible supine position for radiation therapy with her ipsilateral arm over her head, and her upper body secured in a custom-made Vac-lok device.  CT images were obtained.  Surface markings were placed.  The CT images were loaded into the planning software.    TREATMENT PLANNING NOTE: Treatment planning then occurred.  The radiation prescription was entered and confirmed.    A total of 5 medically necessary complex treatment devices were fabricated and supervised by me: 4 fields with MLCs for custom blocks to protect heart, and lungs;  and, a Vac-lok. MORE COMPLEX DEVICES MAY BE MADE IN DOSIMETRY FOR FIELD IN FIELD BEAMS FOR DOSE HOMOGENEITY.  I have requested : 3D Simulation which is medically necessary to give adequate dose to at risk tissues while sparing lungs and heart.  I have requested a DVH of the following structures: lungs, heart, esophagus, lumpectomy cavity.    The patient will receive 50 Gy in 25 fractions to the right breast with 2 fields and 45 Gy in 25 fractions to the posterior axilla/ SCV nodes with more 2 fields.  This will be followed by a breast  boost.  Optical Surface Tracking Plan:  Since intensity modulated radiotherapy (IMRT) and 3D conformal radiation treatment methods are predicated on accurate and precise positioning for treatment, intrafraction motion monitoring is medically necessary to ensure accurate and safe treatment delivery. The ability to quantify intrafraction motion without excessive ionizing radiation dose can only be performed with optical surface tracking. Accordingly, surface imaging offers the opportunity to obtain 3D measurements of patient position throughout IMRT and 3D treatments without excessive radiation exposure. I am ordering optical surface tracking for this patient's upcoming course of radiotherapy.  ________________________________   Reference:  Ursula Alert, J, et al. Surface imaging-based analysis of intrafraction motion for breast radiotherapy patients.Journal of [place], n. 6, nov. 2014. ISSN [id number].  Available at: <http://www.jacmp.org/index.php/jacmp/article/view/4957>.    -----------------------------------  Eppie Gibson, MD

## 2017-02-12 NOTE — Progress Notes (Signed)
Radiation Oncology         [phone] ________________________________  Name: Crystal Logan MRN: [id number]  Date: 02/12/2017  DOB: [birth year]  Follow-Up Visit Note  Outpatient  CC: Jilda Panda, MD  Truitt Merle, MD  Diagnosis:      ICD-9-CM ICD-10-CM   1. Malignant neoplasm of upper-inner quadrant of right breast in female, estrogen receptor positive ([place]) 174.2 C50.211    V86.0 Z17.0    Cancer Staging Malignant neoplasm of upper-inner quadrant of right breast in female, estrogen receptor positive ([place]) Staging form: Breast, AJCC 8th Edition - Clinical stage from 11/27/2016: Stage IA (cT1c, cN0, cM0, G2, ER: Positive, PR: Positive, HER2: Negative) - Signed by Truitt Merle, MD on 12/05/2016 - Pathologic stage from 01/02/2017: Stage IA (pT2, pN1a, cM0, G1, ER: Positive, PR: Positive, HER2: Negative) - Signed by Truitt Merle, MD on 01/10/2017  Pathologic T2N1a, clinical M0 Stage IA Right Breast UIQ Invasive Ductal Carcinoma, ER/PR Positive, Her2 Negative, Grade 1  CHIEF COMPLAINT: Here to discuss management of right breast cancer  Narrative:  The patient returns today for follow-up. She was initially seen in consultation during the multidisciplinary breast clinic on 12/05/16.  Her husband translates today.   Since consultation, she underwent right breast lumpectomy with right axillary sentinel lymph node biopsy on 01/02/17 with Dr. Lucia Gaskins. This revealed invasive lobular carcinoma spanning 2.8 cm, and involving the inferior margin. There was additional lymphovascular involvement. 1/1 biopsied lymph node showed metastatic carcinoma with extranodal extension. The patient underwent re-excision on 01/18/17 with Dr. Lucia Gaskins, this time revealing ADH but no malignancy.  The patient consulted with Dr. Burr Medico on 01/10/17, and according to Dr. Ernestina Penna note the patient has declined chemotherapy in any circumstance.  The patient presents to the clinic today to discuss the role that radiation therapy may play in the  treatment of her disease. She is accompanied today by her husband, who translates for the patient.  On review of systems, the patient denies lymphedema issues. She reports good arm mobility. She denies pain. Overall, the patient is without complaint.  The patient's husband notes that her daughter is coming to visit on July 14th for 2 weeks, and they are concerned about the timing of radiation interfering with this. The patient does not want to receive RT while her daughter is visiting due to wishes to leave town with her.  They are amenable to receive RT through July 20.           ALLERGIES:  is allergic to no known allergies.  Meds: Current Outpatient Prescriptions  Medication Sig Dispense Refill  . olmesartan-hydrochlorothiazide (BENICAR HCT) 20-12.5 MG tablet Take 1 tablet by mouth daily.    Marland Kitchen HYDROcodone-acetaminophen (NORCO/VICODIN) 5-325 MG tablet Take 1-2 tablets by mouth every 6 (six) hours as needed for moderate pain. (Patient not taking: Reported on 02/12/2017) 20 tablet 0   No current facility-administered medications for this encounter.       Physical Findings:  weight is 205 lb 9.6 oz (93.3 kg). Her temperature is 98.3 F (36.8 C). Her blood pressure is 130/85 and her pulse is 73. Her oxygen saturation is 100%.  General: Alert and oriented, in no acute distress. HEENT: Mucous membranes are moist, without lesions in the mouth. Neck: Neck is supple, no palpable cervical or supraclavicular lymphadenopathy or masses. Heart: Regular in rate and rhythm with no murmurs. Chest: Clear to auscultation bilaterally. Abdomen: Soft, non tender, non distended. Extremities: No edema in arms or ankles bilaterally. Lymphatics: see Neck Exam Skin:  No rashes on the breasts bilaterally. Neurologic: No obvious focalities. Speech is fluent.  Psychiatric: Judgment and insight are intact. Affect is appropriate. Breast exam reveals she still has some glue at the superior areola of the right breast  over the lumpectomy scar. She appears to have healed well with no drainage or excessive swelling. Axillary scar has also healed well on the right. The breast is smaller on the right due to surgery.  Lab Findings: Lab Results  Component Value Date   WBC 6.3 01/18/2017   HGB 12.6 01/18/2017   HCT 37.4 01/18/2017   MCV 91.4 01/18/2017   PLT 259 01/18/2017    Radiographic Findings: No results found.  Impression/Plan: We discussed adjuvant radiotherapy today.  I recommend 6 weeks of radiation therapy to the RIGHT breast and SCV/axillary nodes in order to reduce the risk of locoregional recurrence by 2/3.  The risks, benefits and side effects of this treatment were discussed in detail. She understands that radiotherapy is associated with skin irritation and fatigue in the acute setting. Late effects can include cosmetic changes and rare injury to internal organs.  She is enthusiastic about proceeding with treatment. A consent form has been signed and placed in her chart.  CT Simulation today at 2:15 pm, with the intention to begin treatment tomorrow. This will allow for a full 6 weeks of treatment before the patient/family visit with her daughter, who is coming from outside the country, allowing them time to travel with her. The patient understands it is important that she avoid missing any treatments in order to keep her on schedule. The patient and her husband are in agreement with this.  I asked the patient today about tobacco use. The patient still uses tobacco - 4-5 cigs daily.   I encouraged the patient to continue decreasing her tobacco use, with the ultimate goal of smoking cessation. I recommended Nicorette gum to aid in cessation. She seems somewhat motivated. She understands tobacco use can negatively affect RT tolerance.  A total of 5 medically necessary complex treatment devices will be fabricated and supervised by me: 4 fields with MLCs for custom blocks to protect heart, and lungs;   and, a Vac-lok. MORE COMPLEX DEVICES MAY BE MADE IN DOSIMETRY FOR FIELD IN FIELD BEAMS FOR DOSE HOMOGENEITY.  I have requested : 3D Simulation which is medically necessary to give adequate dose to at risk tissues while sparing lungs and heart.  I have requested a DVH of the following structures: lungs, heart, esophagus, lumpectomy cavity.    The patient will receive 50 Gy in 25 fractions to the right breast with 2 fields and 45 Gy in 25 fractions to the posterior axilla/ SCV nodes with more 2 fields.  This will be followed by a breast boost.  I spent 25 minutes face to face with the patient and more than 50% of that time was spent in counseling and/or coordination of care. _____________________________________   Eppie Gibson, MD  This document serves as a record of services personally performed by Eppie Gibson, MD. It was created on her behalf by Maryla Morrow, a trained medical scribe. The creation of this record is based on the scribe's personal observations and the provider's statements to them. This document has been checked and approved by the attending provider.

## 2017-02-13 ENCOUNTER — Ambulatory Visit
Admission: RE | Admit: 2017-02-13 | Discharge: 2017-02-13 | Disposition: A | Discharge status: Home / Self Care | Payer: Medicare Other | Source: Ambulatory Visit | Attending: Radiation Oncology | Admitting: Radiation Oncology

## 2017-02-13 DIAGNOSIS — Z510 Encounter for antineoplastic radiation therapy: Secondary | ICD-10-CM | POA: Diagnosis not present

## 2017-02-14 ENCOUNTER — Ambulatory Visit
Admission: RE | Admit: 2017-02-14 | Discharge: 2017-02-14 | Disposition: A | Discharge status: Home / Self Care | Payer: Medicare Other | Source: Ambulatory Visit | Attending: Radiation Oncology | Admitting: Radiation Oncology

## 2017-02-14 DIAGNOSIS — Z510 Encounter for antineoplastic radiation therapy: Secondary | ICD-10-CM | POA: Diagnosis not present

## 2017-02-15 ENCOUNTER — Ambulatory Visit
Admission: RE | Admit: 2017-02-15 | Discharge: 2017-02-15 | Disposition: A | Discharge status: Home / Self Care | Payer: Medicare Other | Source: Ambulatory Visit | Attending: Radiation Oncology | Admitting: Radiation Oncology

## 2017-02-15 DIAGNOSIS — Z510 Encounter for antineoplastic radiation therapy: Secondary | ICD-10-CM | POA: Diagnosis not present

## 2017-02-15 NOTE — Addendum Note (Signed)
Addendum  created 02/15/17 1233 by Lyn Hollingshead, MD   Sign clinical note

## 2017-02-18 ENCOUNTER — Ambulatory Visit
Admission: RE | Admit: 2017-02-18 | Discharge: 2017-02-18 | Disposition: A | Discharge status: Home / Self Care | Payer: Medicare Other | Source: Ambulatory Visit | Attending: Radiation Oncology | Admitting: Radiation Oncology

## 2017-02-18 ENCOUNTER — Ambulatory Visit: Admission: RE | Admit: 2017-02-18 | Payer: Medicare Other | Source: Ambulatory Visit | Admitting: Radiation Oncology

## 2017-02-18 DIAGNOSIS — Z510 Encounter for antineoplastic radiation therapy: Secondary | ICD-10-CM | POA: Diagnosis not present

## 2017-02-19 ENCOUNTER — Encounter: Payer: Self-pay | Admitting: Radiation Oncology

## 2017-02-19 ENCOUNTER — Ambulatory Visit
Admission: RE | Admit: 2017-02-19 | Discharge: 2017-02-19 | Disposition: A | Discharge status: Home / Self Care | Payer: Medicare Other | Source: Ambulatory Visit | Attending: Radiation Oncology | Admitting: Radiation Oncology

## 2017-02-19 VITALS — BP 147/84 | HR 69 | Temp 99.1°F | Wt 206.2 lb

## 2017-02-19 DIAGNOSIS — Z170 Estrogen receptor positive status [ER+]: Principal | ICD-10-CM

## 2017-02-19 DIAGNOSIS — C50211 Malignant neoplasm of upper-inner quadrant of right female breast: Secondary | ICD-10-CM

## 2017-02-19 DIAGNOSIS — Z510 Encounter for antineoplastic radiation therapy: Secondary | ICD-10-CM | POA: Diagnosis not present

## 2017-02-19 NOTE — Progress Notes (Signed)
Ms. Crystal Logan presents before her 5th fraction of radiation today. She denies pain. She is feeling well. She does have occasional sharp pain to her Right Breast. She was provided education today and will begin using radiaplex twice daily.  BP (!) 147/84   Pulse 69   Temp 99.1 F (37.3 C)   Wt 206 lb 3.2 oz (93.5 kg)   SpO2 98% Comment: room air  BMI 35.39 kg/m    Wt Readings from Last 3 Encounters:  02/19/17 206 lb 3.2 oz (93.5 kg)  02/12/17 205 lb 9.6 oz (93.3 kg)  01/18/17 206 lb 8 oz (93.7 kg)

## 2017-02-19 NOTE — Progress Notes (Signed)
   Weekly Management Note:  Outpatient    ICD-10-CM   1. Malignant neoplasm of upper-inner quadrant of right breast in female, estrogen receptor positive ([place]) C50.211    Z17.0     Current Dose:  8 Gy  Projected Dose: 60 Gy   Narrative:  The patient presents for routine under treatment assessment.  CBCT/MVCT images/Port film x-rays were reviewed.  The chart was checked. Doing well. Quit smoking 2 days ago!  Physical Findings:  weight is 206 lb 3.2 oz (93.5 kg). Her temperature is 99.1 F (37.3 C). Her blood pressure is 147/84 (abnormal) and her pulse is 69. Her oxygen saturation is 98%.   Wt Readings from Last 3 Encounters:  02/19/17 206 lb 3.2 oz (93.5 kg)  02/12/17 205 lb 9.6 oz (93.3 kg)  01/18/17 206 lb 8 oz (93.7 kg)   NAD, mild erythema over right breast  Impression:  The patient is tolerating radiotherapy.  Plan:  Continue radiotherapy as planned. Patient instructed to apply Radiaplex in treatment fields.  Applauded on smoking cessation.   ________________________________   Eppie Gibson, M.D.

## 2017-02-20 ENCOUNTER — Ambulatory Visit
Admission: RE | Admit: 2017-02-20 | Discharge: 2017-02-20 | Disposition: A | Discharge status: Home / Self Care | Payer: Medicare Other | Source: Ambulatory Visit | Attending: Radiation Oncology | Admitting: Radiation Oncology

## 2017-02-20 DIAGNOSIS — Z510 Encounter for antineoplastic radiation therapy: Secondary | ICD-10-CM | POA: Diagnosis not present

## 2017-02-20 MED ORDER — ALRA NON-METALLIC DEODORANT (RAD-ONC)
1.0000 "application " | Freq: Once | TOPICAL | Status: AC
  Administered 2017-02-20: 1 via TOPICAL

## 2017-02-20 MED ORDER — RADIAPLEXRX EX GEL
Freq: Once | CUTANEOUS | Status: AC
  Administered 2017-02-20: 09:00:00 via TOPICAL

## 2017-02-20 NOTE — Progress Notes (Signed)

## 2017-02-21 ENCOUNTER — Telehealth: Payer: Self-pay | Admitting: Hematology

## 2017-02-21 ENCOUNTER — Ambulatory Visit
Admission: RE | Admit: 2017-02-21 | Discharge: 2017-02-21 | Disposition: A | Discharge status: Home / Self Care | Payer: Medicare Other | Source: Ambulatory Visit | Attending: Radiation Oncology | Admitting: Radiation Oncology

## 2017-02-21 DIAGNOSIS — Z510 Encounter for antineoplastic radiation therapy: Secondary | ICD-10-CM | POA: Diagnosis not present

## 2017-02-21 NOTE — Telephone Encounter (Signed)
sw husband t confirm 7/18 appt per sch msg

## 2017-02-22 ENCOUNTER — Ambulatory Visit
Admission: RE | Admit: 2017-02-22 | Discharge: 2017-02-22 | Disposition: A | Discharge status: Home / Self Care | Payer: Medicare Other | Source: Ambulatory Visit | Attending: Radiation Oncology | Admitting: Radiation Oncology

## 2017-02-22 DIAGNOSIS — Z510 Encounter for antineoplastic radiation therapy: Secondary | ICD-10-CM | POA: Diagnosis not present

## 2017-02-25 ENCOUNTER — Ambulatory Visit
Admission: RE | Admit: 2017-02-25 | Discharge: 2017-02-25 | Disposition: A | Discharge status: Home / Self Care | Payer: Medicare Other | Source: Ambulatory Visit | Attending: Radiation Oncology | Admitting: Radiation Oncology

## 2017-02-25 ENCOUNTER — Encounter: Payer: Self-pay | Admitting: Radiation Oncology

## 2017-02-25 VITALS — BP 132/80 | HR 66 | Temp 98.0°F | Resp 20 | Wt 208.0 lb

## 2017-02-25 DIAGNOSIS — C50211 Malignant neoplasm of upper-inner quadrant of right female breast: Secondary | ICD-10-CM

## 2017-02-25 DIAGNOSIS — Z170 Estrogen receptor positive status [ER+]: Principal | ICD-10-CM

## 2017-02-25 DIAGNOSIS — Z510 Encounter for antineoplastic radiation therapy: Secondary | ICD-10-CM | POA: Diagnosis not present

## 2017-02-25 NOTE — Progress Notes (Signed)
Weekly rad txs right breast 9/30 completed, vitals and weight stable,  C/o pain in breast yesterday, mild erythema, only using radiaplex one time dqay, suggessted to start bid, , no other issues, no fatigue, husband speaks mostly for the patient, she speaks very little English 12:48 PM  BP 132/80   Pulse 66   Temp 98 F (36.7 C) (Oral)   Resp 20   Wt 208 lb (94.3 kg)   BMI 35.70 kg/m

## 2017-02-25 NOTE — Progress Notes (Signed)
   Weekly Management Note:  Outpatient    ICD-10-CM   1. Malignant neoplasm of upper-inner quadrant of right breast in female, estrogen receptor positive ([facility]) C50.211    Z17.0     Current Dose:  18 Gy  Projected Dose: 60 Gy   Narrative:  The patient presents for routine under treatment assessment.  CBCT/MVCT images/Port film x-rays were reviewed.  The chart was checked. Doing well. Quit smoking 9 days ago! Some breast pain.  Physical Findings:  weight is 208 lb (94.3 kg). Her oral temperature is 98 F (36.7 C). Her blood pressure is 132/80 and her pulse is 66. Her respiration is 20.   Wt Readings from Last 3 Encounters:  02/25/17 208 lb (94.3 kg)  02/19/17 206 lb 3.2 oz (93.5 kg)  02/12/17 205 lb 9.6 oz (93.3 kg)   NAD, mild erythema over right breast - skin intact  Impression:  The patient is tolerating radiotherapy.  Plan:  Continue radiotherapy as planned. Patient instructed to apply Radiaplex in treatment fields.  Applauded again on smoking cessation.   Breast pain - Aleve. ________________________________   Eppie Gibson, M.D.

## 2017-02-26 ENCOUNTER — Ambulatory Visit
Admission: RE | Admit: 2017-02-26 | Discharge: 2017-02-26 | Disposition: A | Discharge status: Home / Self Care | Payer: Medicare Other | Source: Ambulatory Visit | Attending: Radiation Oncology | Admitting: Radiation Oncology

## 2017-02-26 ENCOUNTER — Encounter: Payer: Self-pay | Admitting: Hematology

## 2017-02-26 DIAGNOSIS — Z510 Encounter for antineoplastic radiation therapy: Secondary | ICD-10-CM | POA: Diagnosis not present

## 2017-02-26 NOTE — Progress Notes (Signed)
Pt's husband came in to discuss financial assistance.  He stated pt is receiving bills from everywhere.  I advised pt can be set up on a payment plan or she can apply for the [employer] card where she can consolidate all of [place] bills and make one payment over a longer period of time.  He seemed interested in that so I gave him the number for customer service I also gave him Ailene Ravel and Cindy's number to discuss financial assistance with them since she is a radiation pt.  He was very Patent attorney.

## 2017-02-27 ENCOUNTER — Ambulatory Visit
Admission: RE | Admit: 2017-02-27 | Discharge: 2017-02-27 | Disposition: A | Discharge status: Home / Self Care | Payer: Medicare Other | Source: Ambulatory Visit | Attending: Radiation Oncology | Admitting: Radiation Oncology

## 2017-02-27 DIAGNOSIS — Z510 Encounter for antineoplastic radiation therapy: Secondary | ICD-10-CM | POA: Diagnosis not present

## 2017-02-28 ENCOUNTER — Ambulatory Visit
Admission: RE | Admit: 2017-02-28 | Discharge: 2017-02-28 | Disposition: A | Discharge status: Home / Self Care | Payer: Medicare Other | Source: Ambulatory Visit | Attending: Radiation Oncology | Admitting: Radiation Oncology

## 2017-02-28 DIAGNOSIS — Z510 Encounter for antineoplastic radiation therapy: Secondary | ICD-10-CM | POA: Diagnosis not present

## 2017-03-01 ENCOUNTER — Ambulatory Visit
Admission: RE | Admit: 2017-03-01 | Discharge: 2017-03-01 | Disposition: A | Discharge status: Home / Self Care | Payer: Medicare Other | Source: Ambulatory Visit | Attending: Radiation Oncology | Admitting: Radiation Oncology

## 2017-03-01 DIAGNOSIS — Z510 Encounter for antineoplastic radiation therapy: Secondary | ICD-10-CM | POA: Diagnosis not present

## 2017-03-04 ENCOUNTER — Ambulatory Visit
Admission: RE | Admit: 2017-03-04 | Discharge: 2017-03-04 | Disposition: A | Discharge status: Home / Self Care | Payer: Medicare Other | Source: Ambulatory Visit | Attending: Radiation Oncology | Admitting: Radiation Oncology

## 2017-03-04 DIAGNOSIS — Z510 Encounter for antineoplastic radiation therapy: Secondary | ICD-10-CM | POA: Diagnosis not present

## 2017-03-05 ENCOUNTER — Ambulatory Visit
Admission: RE | Admit: 2017-03-05 | Discharge: 2017-03-05 | Disposition: A | Discharge status: Home / Self Care | Payer: Medicare Other | Source: Ambulatory Visit | Attending: Radiation Oncology | Admitting: Radiation Oncology

## 2017-03-05 DIAGNOSIS — Z510 Encounter for antineoplastic radiation therapy: Secondary | ICD-10-CM | POA: Diagnosis not present

## 2017-03-06 ENCOUNTER — Ambulatory Visit
Admission: RE | Admit: 2017-03-06 | Discharge: 2017-03-06 | Disposition: A | Discharge status: Home / Self Care | Payer: Medicare Other | Source: Ambulatory Visit | Attending: Radiation Oncology | Admitting: Radiation Oncology

## 2017-03-06 DIAGNOSIS — Z510 Encounter for antineoplastic radiation therapy: Secondary | ICD-10-CM | POA: Diagnosis not present

## 2017-03-07 ENCOUNTER — Ambulatory Visit
Admission: RE | Admit: 2017-03-07 | Discharge: 2017-03-07 | Disposition: A | Discharge status: Home / Self Care | Payer: Medicare Other | Source: Ambulatory Visit | Attending: Radiation Oncology | Admitting: Radiation Oncology

## 2017-03-07 DIAGNOSIS — Z510 Encounter for antineoplastic radiation therapy: Secondary | ICD-10-CM | POA: Diagnosis not present

## 2017-03-08 ENCOUNTER — Ambulatory Visit
Admission: RE | Admit: 2017-03-08 | Discharge: 2017-03-08 | Disposition: A | Discharge status: Home / Self Care | Payer: Medicare Other | Source: Ambulatory Visit | Attending: Radiation Oncology | Admitting: Radiation Oncology

## 2017-03-08 DIAGNOSIS — Z510 Encounter for antineoplastic radiation therapy: Secondary | ICD-10-CM | POA: Diagnosis not present

## 2017-03-11 ENCOUNTER — Ambulatory Visit
Admission: RE | Admit: 2017-03-11 | Discharge: 2017-03-11 | Disposition: A | Discharge status: Home / Self Care | Payer: Medicare Other | Source: Ambulatory Visit | Attending: Radiation Oncology | Admitting: Radiation Oncology

## 2017-03-11 DIAGNOSIS — Z510 Encounter for antineoplastic radiation therapy: Secondary | ICD-10-CM | POA: Diagnosis not present

## 2017-03-12 ENCOUNTER — Ambulatory Visit
Admission: RE | Admit: 2017-03-12 | Discharge: 2017-03-12 | Disposition: A | Discharge status: Home / Self Care | Payer: Medicare Other | Source: Ambulatory Visit | Attending: Radiation Oncology | Admitting: Radiation Oncology

## 2017-03-12 DIAGNOSIS — Z510 Encounter for antineoplastic radiation therapy: Secondary | ICD-10-CM | POA: Diagnosis not present

## 2017-03-14 ENCOUNTER — Ambulatory Visit
Admission: RE | Admit: 2017-03-14 | Discharge: 2017-03-14 | Disposition: A | Discharge status: Home / Self Care | Payer: Medicare Other | Source: Ambulatory Visit | Attending: Radiation Oncology | Admitting: Radiation Oncology

## 2017-03-14 DIAGNOSIS — Z510 Encounter for antineoplastic radiation therapy: Secondary | ICD-10-CM | POA: Diagnosis not present

## 2017-03-15 ENCOUNTER — Ambulatory Visit
Admission: RE | Admit: 2017-03-15 | Discharge: 2017-03-15 | Disposition: A | Discharge status: Home / Self Care | Payer: Medicare Other | Source: Ambulatory Visit | Attending: Radiation Oncology | Admitting: Radiation Oncology

## 2017-03-15 DIAGNOSIS — Z510 Encounter for antineoplastic radiation therapy: Secondary | ICD-10-CM | POA: Diagnosis not present

## 2017-03-18 ENCOUNTER — Ambulatory Visit
Admission: RE | Admit: 2017-03-18 | Discharge: 2017-03-18 | Disposition: A | Discharge status: Home / Self Care | Payer: Medicare Other | Source: Ambulatory Visit | Attending: Radiation Oncology | Admitting: Radiation Oncology

## 2017-03-18 ENCOUNTER — Ambulatory Visit: Admission: RE | Admit: 2017-03-18 | Payer: Medicare Other | Source: Ambulatory Visit | Admitting: Radiation Oncology

## 2017-03-18 DIAGNOSIS — Z170 Estrogen receptor positive status [ER+]: Principal | ICD-10-CM

## 2017-03-18 DIAGNOSIS — Z510 Encounter for antineoplastic radiation therapy: Secondary | ICD-10-CM | POA: Diagnosis not present

## 2017-03-18 DIAGNOSIS — C50211 Malignant neoplasm of upper-inner quadrant of right female breast: Secondary | ICD-10-CM

## 2017-03-18 MED ORDER — RADIAPLEXRX EX GEL
Freq: Once | CUTANEOUS | Status: AC
  Administered 2017-03-18: 10:00:00 via TOPICAL

## 2017-03-19 ENCOUNTER — Ambulatory Visit
Admission: RE | Admit: 2017-03-19 | Discharge: 2017-03-19 | Disposition: A | Discharge status: Home / Self Care | Payer: Medicare Other | Source: Ambulatory Visit | Attending: Radiation Oncology | Admitting: Radiation Oncology

## 2017-03-19 DIAGNOSIS — Z510 Encounter for antineoplastic radiation therapy: Secondary | ICD-10-CM | POA: Diagnosis not present

## 2017-03-20 ENCOUNTER — Ambulatory Visit: Payer: Medicare Other | Attending: Surgery | Admitting: Physical Therapy

## 2017-03-20 ENCOUNTER — Ambulatory Visit
Admission: RE | Admit: 2017-03-20 | Discharge: 2017-03-20 | Disposition: A | Discharge status: Home / Self Care | Payer: Medicare Other | Source: Ambulatory Visit | Attending: Radiation Oncology | Admitting: Radiation Oncology

## 2017-03-20 ENCOUNTER — Ambulatory Visit: Payer: Medicare Other

## 2017-03-20 DIAGNOSIS — R293 Abnormal posture: Secondary | ICD-10-CM | POA: Diagnosis present

## 2017-03-20 DIAGNOSIS — R600 Localized edema: Secondary | ICD-10-CM | POA: Diagnosis not present

## 2017-03-20 DIAGNOSIS — Z510 Encounter for antineoplastic radiation therapy: Secondary | ICD-10-CM | POA: Diagnosis not present

## 2017-03-20 NOTE — Therapy (Signed)
[place], [place], [address] Phone: [phone]   Fax:  [phone]  Physical Therapy Evaluation  Patient Details  Name: Crystal Logan MRN: [id number] Date of Birth: Aug 14, 1951 Referring Provider: Dr. Alphonsa Overall  Encounter Date: 03/20/2017      PT End of Session - 03/20/17 1249    Visit Number 1   Number of Visits 1   Date for PT Re-Evaluation 04/26/17   PT Start Time 0849   PT Stop Time 0933   PT Time Calculation (min) 44 min   Activity Tolerance Patient tolerated treatment well   Behavior During Therapy [facility] for tasks assessed/performed      Past Medical History:  Diagnosis Date  . Breast cancer ([place]) 12/2016   right  . Hypertension    states under control with med., has been on med. ~ 15 yr.    Past Surgical History:  Procedure Laterality Date  . BREAST LUMPECTOMY WITH RADIOACTIVE SEED AND SENTINEL LYMPH NODE BIOPSY Right 01/02/2017   Procedure: RIGHT BREAST LUMPECTOMY WITH RADIOACTIVE SEED AND RIGHT AXILLARY SENTINEL LYMPH NODE BIOPSY;  Surgeon: Alphonsa Overall, MD;  Location: [facility];  Service: General;  Laterality: Right;  . RE-EXCISION OF BREAST LUMPECTOMY Right 01/18/2017   Procedure: RE-EXCISION OF BREAST LUMPECTOMY;  Surgeon: Alphonsa Overall, MD;  Location: [facility];  Service: General;  Laterality: Right;  . TONSILLECTOMY      There were no vitals filed for this visit.       Subjective Assessment - 03/20/17 0853    Subjective Husband speaks for his wife and says it was a surprise to be called for this appointment today. He says the breast was painful and red, and was drained by surgeon on Monday.   Patient is accompained by: Family member  husband   Pertinent History Right lumpectomy and SLNB on 01/02/17 followed by reexcision on 01/18/17 to get clear margins. Now has 7 radiation treatments remaining. HTN controlled with meds   Patient Stated Goals unsure   Currently in  Pain? Yes   Pain Score 5    Pain Location Breast   Pain Orientation Right   Pain Descriptors / Indicators Sharp   Pain Frequency Intermittent   Aggravating Factors  doesn't know   Pain Relieving Factors it's brief so just goes away            [facility] PT Assessment - 03/20/17 0001      Assessment   Medical Diagnosis right breast cancer s/p lumpectomy with reexcision; SLNB   Referring Provider Dr. Alphonsa Overall   Onset Date/Surgical Date 01/18/17   Hand Dominance Right   Prior Therapy none     Precautions   Precautions Other (comment)   Precaution Comments cancer precautions, currently in XRT     Restrictions   Weight Bearing Restrictions No     Balance Screen   Has the patient fallen in the past 6 months No   Has the patient had a decrease in activity level because of a fear of falling?  No   Is the patient reluctant to leave their home because of a fear of falling?  No     Home Environment   Living Environment Private residence   Living Arrangements Spouse/significant other   Type of [place] One level     Prior Function   Level of Independence Independent  need interpreter   Vocation Retired   Leisure no regular exercise, but  has stationary bike, treadmill, other stuff     Cognition   Overall Cognitive Status Within Functional Limits for tasks assessed  but through husband's interpreting     Observation/Other Assessments   Observations right breast is very red throughout   Skin Integrity incisions (SLNB and lumpectomy at superior nipple line) are well healed     ROM / Strength   AROM / PROM / Strength AROM     AROM   AROM Assessment Site Shoulder   Right/Left Shoulder Right;Left   Right Shoulder Flexion 165 Degrees   Right Shoulder ABduction 180 Degrees   Right Shoulder Internal Rotation 85 Degrees  sitting   Right Shoulder External Rotation 100 Degrees   Left Shoulder Flexion 147 Degrees   Left Shoulder ABduction 180 Degrees   Left  Shoulder Internal Rotation --  WFL in sitting   Left Shoulder External Rotation --  WFL in sitting     Palpation   Palpation comment right breast is indurated     Ambulation/Gait   Ambulation/Gait Yes   Ambulation/Gait Assistance 7: Independent           LYMPHEDEMA/ONCOLOGY QUESTIONNAIRE - 03/20/17 0911      Type   Cancer Type right breast     Surgeries   Lumpectomy Date 01/18/17   Sentinel Lymph Node Biopsy Date 01/02/17     Treatment   Active Radiation Treatment Yes     Lymphedema Assessments   Lymphedema Assessments Upper extremities     Right Upper Extremity Lymphedema   10 cm Proximal to Olecranon Process 33.5 cm   Olecranon Process 29.2 cm   10 cm Proximal to Ulnar Styloid Process 26.5 cm   Just Proximal to Ulnar Styloid Process 17.8 cm   Across Hand at [employer] 21.8 cm   At [place] of 2nd Digit 7.1 cm     Left Upper Extremity Lymphedema   10 cm Proximal to Olecranon Process 33.3 cm   Olecranon Process 29 cm   10 cm Proximal to Ulnar Styloid Process 25.8 cm   Just Proximal to Ulnar Styloid Process 17.1 cm   Across Hand at [employer] 21.4 cm   At [place] of 2nd Digit 6.8 cm         Objective measurements completed on examination: See above findings.          [facility] PT Treatment/Exercise - 03/20/17 0001      Self-Care   Self-Care Other Self-Care Comments   Other Self-Care Comments  cut 1/2" think foam rectangle and placed in TG soft, then had patient place between bra and lower breast.                PT Education - 03/20/17 1248    Education provided Yes   Education Details about use of foam pad for breast compression; about obtaining a compression bra at [employer] [place] or a more compressive sports bra than what she has   Person(s) Educated Patient;Spouse   Methods Teacher, music of [place] bra, picture of [place] sports bra, info on [employer] location   Comprehension Verbalized understanding                 [facility] Goals - 03/20/17 1254      CC Long Term Goal  #1   Title Pt. will be knowledgeable about use of compression for breast swelling.   Status Achieved  Plan - 03/20/17 1250    Clinical Impression Statement s/p right breast cancer with lumpectomy, reexcision, and SLNB, now in course of radiation.  Her breast is quite red and she had fluid aspirated from it two days ago. Pt. is to try foam insert in bra to add compression and also to obtain a more compressive bra (she is wearing a stretchy one today).  She has 7 radiation treatments left; after she is finished, husband has been asked to call if they have any further needs.   History and Personal Factors relevant to plan of care: HTN   Clinical Presentation Evolving   Clinical Presentation due to: newer breast swelling, still in XRT   Clinical Decision Making Low   Rehab Potential Good   PT Frequency One time visit   PT Treatment/Interventions ADLs/Self Care Home Management;Patient/family education   PT Next Visit Plan No follow-up planned currently.  The patient was given some instruction today and is to try that.  After completing radiation treatments (7 more to go), she is to call if she has further therapy needs.   Consulted and Agree with Plan of Care Patient;Family member/caregiver      Patient will benefit from skilled therapeutic intervention in order to improve the following deficits and impairments:  Increased edema, Pain, Decreased knowledge of precautions, Decreased knowledge of use of DME  Visit Diagnosis: Localized edema - Plan: PT plan of care cert/re-cert  Abnormal posture - Plan: PT plan of care cert/re-cert      G-Codes - 54/62/70 1255    Functional Assessment Tool Used (Outpatient Only) clinical judgement   Functional Limitation Self care   Self Care Current Status (J5009) At least 40 percent but less than 60 percent impaired, limited or restricted   Self Care  Goal Status (F8182) At least 1 percent but less than 20 percent impaired, limited or restricted   Self Care Discharge Status [phone]) At least 1 percent but less than 20 percent impaired, limited or restricted       Problem List Patient Active Problem List   Diagnosis Date Noted  . Malignant neoplasm of upper-inner quadrant of right breast in female, estrogen receptor positive ([place]) 11/30/2016    Rasa Degrazia 03/20/2017, 12:57 PM  [place], [place], [address] Phone: [phone]   Fax:  [phone]  Name: Daveda Larock MRN: [id number] Date of Birth: [birth year]  Serafina Royals, PT 03/20/17 12:58 PM

## 2017-03-21 ENCOUNTER — Ambulatory Visit: Payer: Medicare Other

## 2017-03-22 ENCOUNTER — Encounter: Payer: Self-pay | Admitting: Radiation Oncology

## 2017-03-22 ENCOUNTER — Ambulatory Visit: Payer: Medicare Other

## 2017-03-22 NOTE — Progress Notes (Signed)
  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 The patient reported some fatigue and occasional sharp pains over her right breast throughout treatment. She also developed radiation related skin changes including erythema over the right breast. She later developed a painful seroma.  She was referred to her surgeon and her boost to the surgical bed was held.  Plan: The patient has completed radiation treatment. The patient may use Radiaplex or aloe vera gel to the skin within her treatment field. The patient will return to radiation oncology clinic for routine followup in one month. I advised them to call or return sooner if they have any questions or concerns related to their recovery or treatment.  -----------------------------------  Eppie Gibson, MD  This document serves as a record of services personally performed by Eppie Gibson, MD. It was created on her behalf by Maryla Morrow, a trained medical scribe. The creation of this record is based on the scribe's personal observations and the provider's statements to them. This document has been checked and approved by the attending provider.

## 2017-03-22 NOTE — Progress Notes (Signed)
[name]  [phone] [phone]  Clinic Follow-Up Note   Patient Care Team: Jilda Panda, MD as PCP - General (Internal Medicine) Alphonsa Overall, MD as Consulting Physician (General Surgery) Truitt Merle, MD as Consulting Physician (Hematology) Eppie Gibson, MD as Attending Physician (Radiation Oncology) 03/27/2017  CHIEF COMPLAINTS:  Follow up right breast cancer  Oncology History   Cancer Staging Malignant neoplasm of upper-inner quadrant of right breast in female, estrogen receptor positive ([place]) Staging form: Breast, AJCC 8th Edition - Clinical stage from 11/27/2016: Stage IA (cT1c, cN0, cM0, G2, ER: Positive, PR: Positive, HER2: Negative) - Signed by Truitt Merle, MD on 12/05/2016       Malignant neoplasm of upper-inner quadrant of right breast in female, estrogen receptor positive ([place])   11/16/2016 Mammogram    Diagnostic mammogram and US showed suspicious asymmetry/distortion within the inner right breast, 3 o'clock axis region, at middle depth, without definite sonographic Correlated, axilla (-)      11/27/2016 Receptors her2    ER 90%+ PR 40%+ HER2 - Ki67 5%      11/27/2016 Initial Biopsy    Breast, right, needle core biopsy, 3:00 o'clock - INVASIVE LOBULAR CARCINOMA, G1-2      11/27/2016 Initial Diagnosis    Malignant neoplasm of upper-inner quadrant of right breast in female, estrogen receptor positive ([place])      12/10/2016 Imaging    MR Breast Bilateral IMPRESSION: 1. Post biopsy changes and associated non masslike enhancement in the medial aspect of the right breast. 2. Left breast is negative. 3. No MRI evidence for adenopathy.      12/26/2016 Procedure    Colonoscopy  - Three 3 to 6 mm polyps in the descending colon, removed with a cold snare. Resected and retrieved. - The examination was otherwise normal on direct and retroflexion views.      12/26/2016 Pathology Results    Diagnosis Surgical [P], descending,  polyps (3) -TUBULAR ADENOMAS. -NO HIGH GRADE DYSPLASIA OR MALIGNANCY IDENTIFIED.      01/02/2017 Surgery    Right Breast lumpectomy with sentinel lymph node biopsy performed by Dr. Lucia Gaskins.      01/02/2017 Pathology Results    Diagnosis  1. Breast, lumpectomy, Right - INVASIVE LOBULAR CARCINOMA, 2.8 CM. - INVASIVE CARCINOMA BROADLY INVOLVES INFERIOR MARGIN. - PREVIOUS BIOPSY SITE. - FOCAL LYMPH VASCULAR INVOLVEMENT BY TUMOR. 2. Lymph node, sentinel, biopsy, Right Axillary - METASTATIC CARCINOMA IN ONE LYMPH NODE WITH EXTRANODAL EXTENSION (1/1).      01/18/2017 Surgery    RE-EXCISION OF BREAST LUMPECTOMY by Dr. Lucia Gaskins       01/18/2017 Pathology Results    Diagnosis 01/18/17 Breast, excision, Right Inferior Margin - ATYPICAL DUCTAL HYPERPLASIA. - FIBROCYSTIC CHANGES WITH CALCIFICATIONS. - VASCULAR CALCIFICATIONS. - SEE COMMENT. Microscopic Comment The surgical resection margin(s) of the specimen were inked and microscopically evaluated        - 03/20/2017 Radiation Therapy    Radiation by Dr. Isidore Moos         HISTORY OF PRESENTING ILLNESS (12/05/2016):  Crystal Logan 66 y.o. female who presented for routine screening mammogram on 10/12/2016 and was found to have possible right breast asymmetry. Accordingly the patient underwent diagnostic mammogram and right breast ultrasound on 11/16/2016. These scans showed persistent asymmetry within the inner right breast, 3 o'clock axis region, at middle depth, with associated architectural distortion. Biopsy of the right breast on 11/27/2016 revealed invasive mammary carcinoma, grade 1-2 / ER 90% / PR 40% / HER-2 negative / Ki67 5%.  The patient's case was presented to our multidisciplinary tumor board and she is seen in breast clinic today. She is here for further evaluation and treatment recommendations.   Crystal Logan is doing well overall. Our visit was facilitated by the patient's husband who translated throughout the visit. She has never had  surgery before and is nervous about the procedure. She did not self palpate the breast mass. This was her first screening mammogram. She injured her breast three years ago and had intermittent pain in the breast since. This pain was worse with change in the weather. She reports a palpable lump post-biopsy. She denies pain at the biopsy site. She denies joint pain. She has no other major health issues. She has no family history of cancer. She smokes about 6-7 cigarettes daily. She has been smoking since 66 years-old. She did not experience hot flashes with menopause. She has not had a bone density scan.  GYN HISTORY  Menarchal: 21 or 66 years-old LMP: Around 66 years-old Contraceptive: None HRT: None GP: 1  CURRENT THERAPY: Letrozole once daily for 7 years starting 04/10/17  INTERVAL HISTORY:  Crystal Logan returns to the clinic today for routine follow up. She presents to the clinic today with her husband who translates for her. She still has pain and swelling from radiation. And the shooting pain from her surgery. She is able to sleep with her pain because the pain comes and goes.  She will be going back to Dr. Lucia Gaskins this month for drainage. She was given an arm sleeve and bra to help.   Her menopause was with hot flashes that lasted only 7 days.  She had blood test done by PCP last week that showed her results were overall normal.  Her husband reports she has stopped smoking.    MEDICAL HISTORY:  Past Medical History:  Diagnosis Date  . Breast cancer ([place]) 12/2016   right  . Hypertension    states under control with med., has been on med. ~ 15 yr.    SURGICAL HISTORY: Past Surgical History:  Procedure Laterality Date  . BREAST LUMPECTOMY WITH RADIOACTIVE SEED AND SENTINEL LYMPH NODE BIOPSY Right 01/02/2017   Procedure: RIGHT BREAST LUMPECTOMY WITH RADIOACTIVE SEED AND RIGHT AXILLARY SENTINEL LYMPH NODE BIOPSY;  Surgeon: Alphonsa Overall, MD;  Location: [facility];  Service:  General;  Laterality: Right;  . RE-EXCISION OF BREAST LUMPECTOMY Right 01/18/2017   Procedure: RE-EXCISION OF BREAST LUMPECTOMY;  Surgeon: Alphonsa Overall, MD;  Location: [facility];  Service: General;  Laterality: Right;  . TONSILLECTOMY      SOCIAL HISTORY: Social History   Social History  . Marital status: Married    Spouse name: N/A  . Number of children: N/A  . Years of education: N/A   Occupational History  . Not on file.   Social History Main Topics  . Smoking status: Current Every Day Smoker    Packs/day: 0.25    Years: 47.00    Types: Cigarettes  . Smokeless tobacco: Never Used  . Alcohol use Yes     Comment: occasionally  . Drug use: No  . Sexual activity: Not on file   Other Topics Concern  . Not on file   Social History Narrative  . No narrative on file    FAMILY HISTORY: History reviewed. No pertinent family history.  ALLERGIES:  is allergic to no known allergies.  MEDICATIONS:  Current Outpatient Prescriptions  Medication Sig Dispense Refill  . HYDROcodone-acetaminophen (NORCO/VICODIN) 5-325 MG  tablet Take 1-2 tablets by mouth every 6 (six) hours as needed for moderate pain. 20 tablet 0  . olmesartan-hydrochlorothiazide (BENICAR HCT) 20-12.5 MG tablet Take 1 tablet by mouth daily.    Marland Kitchen letrozole (FEMARA) 2.5 MG tablet Take 1 tablet (2.5 mg total) by mouth daily. 30 tablet 2   No current facility-administered medications for this visit.     REVIEW OF SYSTEMS:   Constitutional: Denies fevers, chills or abnormal night sweats Eyes: Denies blurriness of vision, double vision or watery eyes Ears, nose, mouth, throat, and face: Denies mucositis or sore throat Respiratory: Denies cough, dyspnea or wheezes Cardiovascular: Denies palpitation, chest discomfort or lower extremity swelling Gastrointestinal:  Denies nausea, heartburn or change in bowel habits Skin: Denies abnormal skin rashes (+) pain to incision site Lymphatics: Denies new lymphadenopathy or easy  bruising Neurological:Denies numbness, tingling or new weaknesses Behavioral/Psych: Mood is stable, no new changes  All other systems were reviewed with the patient and are negative.  PHYSICAL EXAMINATION: ECOG PERFORMANCE STATUS: 0 - Asymptomatic  Vitals:   03/27/17 0951  BP: (!) 151/82  Pulse: 78  Resp: 18  Temp: 98.4 F (36.9 C)   Filed Weights   03/27/17 0951  Weight: 211 lb 11.2 oz (96 kg)    GENERAL:alert, no distress and comfortable SKIN: skin color, texture, turgor are normal, no rashes or significant lesions EYES: normal, conjunctiva are pink and non-injected, sclera clear OROPHARYNX:no exudate, no erythema and lips, buccal mucosa, and tongue normal  NECK: supple, thyroid normal size, non-tender, without nodularity LYMPH:  no palpable lymphadenopathy in the cervical, axillary or inguinal LUNGS: clear to auscultation and percussion with normal breathing effort HEART: regular rate & rhythm and no murmurs and no lower extremity edema ABDOMEN:abdomen soft, non-tender and normal bowel sounds Musculoskeletal:no cyanosis of digits and no clubbing  PSYCH: alert & oriented x 3 with fluent speech NEURO: no focal motor/sensory deficits BREAST: Right breast surgical incision is well healed with no sign of infection. (+) diffused skin erythema but no skin breakdown from radiation, no blisters (+) left breast is very firm especially in central part of breast   LABORATORY DATA:  I have reviewed the data as listed CBC Latest Ref Rng & Units 01/18/2017 01/10/2017 12/05/2016  WBC 4.0 - 10.5 K/uL 6.3 8.7 5.6  Hemoglobin 12.0 - 15.0 g/dL 12.6 13.2 13.8  Hematocrit 36.0 - 46.0 % 37.4 39.6 40.6  Platelets 150 - 400 K/uL 259 284 283    CMP Latest Ref Rng & Units 01/18/2017 01/10/2017 12/05/2016  Glucose 65 - 99 mg/dL 102(H) 104 115  BUN 6 - 20 mg/dL 12 12.0 13.4  Creatinine 0.44 - 1.00 mg/dL 0.66 0.8 0.8  Sodium 135 - 145 mmol/L 138 139 139  Potassium 3.5 - 5.1 mmol/L 4.0 4.2 4.7    Chloride 101 - 111 mmol/L 107 - -  CO2 22 - 32 mmol/L _0 Calcium 8.9 - 10.3 mg/dL 8.8(L) 9.6 9.5  Total Protein 6.4 - 8.3 g/dL - 7.2 7.5  Total Bilirubin 0.20 - 1.20 mg/dL - 0.38 0.38  Alkaline Phos 40 - 150 U/L - 89 85  AST 5 - 34 U/L - 12 15  ALT 0 - 55 U/L - 28 19    PATHOLOGY  Diagnosis 01/18/17 Breast, excision, Right Inferior Margin - ATYPICAL DUCTAL HYPERPLASIA. - FIBROCYSTIC CHANGES WITH CALCIFICATIONS. - VASCULAR CALCIFICATIONS. - SEE COMMENT. Microscopic Comment The surgical resection margin(s) of the specimen were inked and microscopically evaluated   Diagnosis 01/02/17  1. Breast, lumpectomy, Right - INVASIVE LOBULAR CARCINOMA, 2.8 CM. - INVASIVE CARCINOMA BROADLY INVOLVES INFERIOR MARGIN. - PREVIOUS BIOPSY SITE. - FOCAL LYMPH VASCULAR INVOLVEMENT BY TUMOR. 2. Lymph node, sentinel, biopsy, Right Axillary - METASTATIC CARCINOMA IN ONE LYMPH NODE WITH EXTRANODAL EXTENSION (1/1). Microscopic Comment 1. BREAST, INVASIVE TUMOR Procedure: Localized lumpectomy and one sentinel lymph node. Laterality: Right breast. Tumor Size: 2.8 cm Histologic Type: Lobular Grade: I Tubular Differentiation: 3 Nuclear Pleomorphism: 1 Mitotic Count: 1 Ductal Carcinoma in Situ (DCIS): No Extent of Tumor: Skin: N/A Nipple: N/A Skeletal muscle: N/A Margins: Invasive carcinoma, distance from closest margin: Inferior margin broadly involved by invasive carcinoma. DCIS, distance from closest margin: N/A Regional Lymph Nodes: Number of Lymph Nodes Examined: 1 Number of Sentinel Lymph Nodes Examined: 1 Lymph Nodes with Macrometastases: 1 Lymph Nodes with Micrometastases: 0 Lymph Nodes with Isolated Tumor Cells: 0 1 of 3 FINAL for Logan, Crystal Logan ([id number]) Microscopic Comment(continued) Breast Prognostic Profile: Case # [id number] Estrogen Receptor: 90%, positive, strong staining Progesterone Receptor: 40%, positive, strong staining Her2: Negative, ratio 1.31 Ki-67:  5% Best tumor block for sendout testing: 1J Pathologic Stage Classification (pTNM, AJCC 8th Edition): Primary Tumor (pT): pT2 Regional Lymph Nodes (pN): pN1a Distant Metastases (pM): pMX (JDP:ecj 01/03/2017)  Diagnosis 12/26/16 Surgical [P], descending, polyps (3) -TUBULAR ADENOMAS. -NO HIGH GRADE DYSPLASIA OR MALIGNANCY IDENTIFIED.  Diagnosis 11/27/2016 Breast, right, needle core biopsy, 3:00 o'clock - INVASIVE MAMMARY CARCINOMA. - SEE COMMENT. Microscopic Comment The carcinoma appears grade 1-2. An E-Cadherin stain and a breast prognostic profile will be performed and the results reported separately. The results were called to [facility] on 11/28/16. (JBK:gt, 11/28/16) The malignant cells are negative for E-Cadherin, supporting a lobular phenotype. (JBK:kh 11/29/16) PROGNOSTIC INDICATORS Results: IMMUNOHISTOCHEMICAL AND MORPHOMETRIC ANALYSIS PERFORMED MANUALLY Estrogen Receptor: 90%, POSITIVE, STRONG STAINING INTENSITY Progesterone Receptor: 40%, POSITIVE, STRONG STAINING INTENSITY Proliferation Marker Ki67: 5% FLUORESCENCE IN-SITU HYBRIDIZATION Results: HER2 - NEGATIVE RATIO OF HER2/CEP17 SIGNALS 1.31 AVERAGE HER2 COPY NUMBER PER CELL 2.75   PROCEDURES Colonoscopy 12/26/16 - Three 3 to 6 mm polyps in the descending colon, removed with a cold snare. Resected and retrieved. - The examination was otherwise normal on direct and retroflexion views.  RADIOGRAPHIC STUDIES: I have personally reviewed the radiological images as listed and agreed with the findings in the report. No results found.  ASSESSMENT & PLAN:  66 y.o. post-menopausal female with   1. 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 -She underwent a second surgery for positive margins, which showed no  evidence of malignancy. -Patient will not consider adjuvant chemotherapy, onoctype was not sent. -Giving her lobular histology, low grade, strongly ER and PR positive, HER-2 negative disease, my clinical suspicion this is low risk disease. -Given the strong ER and PR positivity, I do recommend adjuvant aromatase inhibitor to reduce her risk of cancer recurrence,  The potential benefit and side effects, which includes but not limited to, hot flash, skin and vaginal dryness, metabolic changes ( increased blood glucose, cholesterol, weight, etc.), slightly in increased risk of cardiovascular disease, cataracts, muscular and joint discomfort, osteopenia and osteoporosis, etc, were discussed with her in great details. She is interested, and we'll start in a few weeks when she recovers well from radiation. -she has completed RT 03/20/17 and is recovering.  -I suggest she watches her  diet and that she exercise. -Labs from her PCP last week was reviewed and CBC and CMP normal. -We discussed breast cancer surveillance, including annual mammogram, self-exam, routine follow-up with lab and exam.  -She will start Letrozole 04/10/17 and I will f/u with her in Early 06/2017  2. Tobacco use - She has been smoking since 66 years-old. She previously smoked 1 ppd but has cut down. -We previously discussed smoking cessation -She has stopped smoking.   3. HTN -She'll continue medication and follow-up with her primary care physician  4. Bone Health  -We discussed his risk of osteopenia and osteoporosis from aromatase inhibitor -She has not had a bone density scan before, we'll obtain one this year as a baseline     Plan: -Start Letrozole once daily 04/10/17, prescription was called in today -Labs and f/u in early Oct 2018   No orders of the defined types were placed in this encounter.   All questions were answered. The patient knows to call the clinic with any problems, questions or concerns.  I spent 25  minutes counseling the patient face to face. The total time spent in the appointment was 30 minutes and more than 50% was on counseling.  This document serves as a record of services personally performed by Truitt Merle, MD. It was created on her behalf by Joslyn Devon, a trained medical scribe. The creation of this record is based on the scribe's personal observations and the provider's statements to them. This document has been checked and approved by the attending provider.     Truitt Merle, MD 03/27/2017

## 2017-03-25 ENCOUNTER — Ambulatory Visit: Payer: Medicare Other

## 2017-03-26 ENCOUNTER — Ambulatory Visit: Payer: Medicare Other

## 2017-03-26 ENCOUNTER — Encounter: Payer: Self-pay | Admitting: *Deleted

## 2017-03-27 ENCOUNTER — Ambulatory Visit: Payer: Medicare Other

## 2017-03-27 ENCOUNTER — Telehealth: Payer: Self-pay | Admitting: Hematology

## 2017-03-27 ENCOUNTER — Encounter: Payer: Self-pay | Admitting: Hematology

## 2017-03-27 ENCOUNTER — Ambulatory Visit (HOSPITAL_BASED_OUTPATIENT_CLINIC_OR_DEPARTMENT_OTHER): Payer: Medicare Other | Admitting: Hematology

## 2017-03-27 VITALS — BP 151/82 | HR 78 | Temp 98.4°F | Resp 18 | Ht 64.0 in | Wt 211.7 lb

## 2017-03-27 DIAGNOSIS — C50211 Malignant neoplasm of upper-inner quadrant of right female breast: Secondary | ICD-10-CM | POA: Diagnosis present

## 2017-03-27 DIAGNOSIS — Z87891 Personal history of nicotine dependence: Secondary | ICD-10-CM

## 2017-03-27 DIAGNOSIS — Z170 Estrogen receptor positive status [ER+]: Secondary | ICD-10-CM

## 2017-03-27 MED ORDER — LETROZOLE 2.5 MG PO TABS: 2.5000 mg | ORAL_TABLET | Freq: Every day | ORAL | 2 refills | Status: DC

## 2017-03-27 NOTE — Telephone Encounter (Signed)
Scheduled appt per 7/18 los - Gave patient AVS and calender per los. Lab and f/u early October .

## 2017-03-28 ENCOUNTER — Ambulatory Visit: Payer: Medicare Other

## 2017-03-29 ENCOUNTER — Ambulatory Visit: Payer: Medicare Other

## 2017-04-01 ENCOUNTER — Ambulatory Visit: Payer: Medicare Other

## 2017-04-02 ENCOUNTER — Ambulatory Visit: Payer: Medicare Other

## 2017-04-03 ENCOUNTER — Ambulatory Visit: Payer: Medicare Other

## 2017-04-04 ENCOUNTER — Ambulatory Visit: Payer: Medicare Other

## 2017-04-12 ENCOUNTER — Other Ambulatory Visit: Payer: Self-pay | Admitting: Student

## 2017-04-12 ENCOUNTER — Ambulatory Visit
Admission: RE | Admit: 2017-04-12 | Discharge: 2017-04-12 | Disposition: A | Discharge status: Home / Self Care | Payer: Medicare Other | Source: Ambulatory Visit | Attending: Student | Admitting: Student

## 2017-04-12 DIAGNOSIS — N6489 Other specified disorders of breast: Secondary | ICD-10-CM

## 2017-04-16 ENCOUNTER — Telehealth: Payer: Self-pay

## 2017-04-16 NOTE — Telephone Encounter (Signed)
Called and left a message with a new appt due to bmdc  Kolyn Rozario 

## 2017-05-17 ENCOUNTER — Telehealth: Payer: Self-pay | Admitting: *Deleted

## 2017-05-17 NOTE — Telephone Encounter (Signed)
CALLED PATIENT TO INFORM OF FU APPT. WITH DR. Isidore Moos ON 05-31-17 @ 11:40 AM, LVM FOR A RETURN CALL

## 2017-05-27 ENCOUNTER — Encounter: Payer: Self-pay | Admitting: Radiation Oncology

## 2017-05-31 ENCOUNTER — Ambulatory Visit: Admission: RE | Admit: 2017-05-31 | Payer: Medicare Other | Source: Ambulatory Visit | Admitting: Radiation Oncology

## 2017-05-31 HISTORY — DX: Personal history of irradiation: Z92.3

## 2017-06-12 ENCOUNTER — Ambulatory Visit: Payer: Medicare Other | Admitting: Hematology

## 2017-06-12 ENCOUNTER — Other Ambulatory Visit: Payer: Medicare Other

## 2017-06-13 ENCOUNTER — Telehealth: Payer: Self-pay | Admitting: *Deleted

## 2017-06-13 ENCOUNTER — Other Ambulatory Visit: Payer: Medicare Other

## 2017-06-13 ENCOUNTER — Encounter: Payer: Medicare Other | Admitting: Hematology

## 2017-06-13 NOTE — Telephone Encounter (Signed)
Pt had missed appt this morning, Dr wanted to know why she did not show and if she was ok.  Also wanted to try to reschedule.  Also wanted to inquire about  the new med she started in Aug  (Letrozole).

## 2017-06-14 ENCOUNTER — Encounter: Payer: Self-pay | Admitting: Radiation Oncology

## 2017-06-14 ENCOUNTER — Ambulatory Visit
Admission: RE | Admit: 2017-06-14 | Discharge: 2017-06-14 | Disposition: A | Discharge status: Home / Self Care | Payer: Medicare Other | Source: Ambulatory Visit | Attending: Radiation Oncology | Admitting: Radiation Oncology

## 2017-06-14 DIAGNOSIS — C50211 Malignant neoplasm of upper-inner quadrant of right female breast: Secondary | ICD-10-CM | POA: Insufficient documentation

## 2017-06-14 DIAGNOSIS — F17210 Nicotine dependence, cigarettes, uncomplicated: Secondary | ICD-10-CM | POA: Insufficient documentation

## 2017-06-14 DIAGNOSIS — Z79899 Other long term (current) drug therapy: Secondary | ICD-10-CM | POA: Diagnosis not present

## 2017-06-14 DIAGNOSIS — Z510 Encounter for antineoplastic radiation therapy: Secondary | ICD-10-CM | POA: Diagnosis present

## 2017-06-14 DIAGNOSIS — Z170 Estrogen receptor positive status [ER+]: Secondary | ICD-10-CM | POA: Insufficient documentation

## 2017-06-14 NOTE — Progress Notes (Signed)
Ms. Crystal Logan presents for follow up of radiation completed 03/20/17 to her Right Breast. She reports soreness to her Right Breast, and wears an ice pack daily at the advice of her physicial therapist. She has continued fatigue. She quit smoking about 4 months ago, and tells me she is hungry frequently. She started letrozole on 04/10/17. She has a survivorship appointment on 07/04/17.  BP 128/75   Pulse 73   Temp 98.3 F (36.8 C)   Wt 210 lb 9.6 oz (95.5 kg)   SpO2 98% Comment: room air  BMI 36.15 kg/m    Wt Readings from Last 3 Encounters:  06/14/17 210 lb 9.6 oz (95.5 kg)  03/27/17 211 lb 11.2 oz (96 kg)  02/25/17 208 lb (94.3 kg)

## 2017-06-14 NOTE — Progress Notes (Signed)
Radiation Oncology         [phone] ________________________________  Name: Crystal Logan MRN: [id number]  Date: 06/14/2017  DOB: June 15, 1951  Follow-Up Visit Note  outpatient  CC: Jilda Panda, MD  Alphonsa Overall, MD  Diagnosis and Prior Radiotherapy:    ICD-10-CM   1. Malignant neoplasm of upper-inner quadrant of right breast in female, estrogen receptor positive ([place]) C50.211 Ambulatory referral to Physical Therapy   Z17.0    Pathologic T2N1a, clinical M0 Stage IA Right Breast UIQ Invasive Ductal Carcinoma, ER/PR Positive, Her2 Negative, Grade 1  CHIEF COMPLAINT: Here for follow-up and surveillance of right breast cancer.  Narrative:  The patient returns today for routine follow-up. Of note since the patient last visit, she has had an ultrasound of the right breast including the axilla completed on 04/12/17 with results revealing an approximate 6 cm mildly complex postoperative seroma involving the right breast. Pt has also had an aspiration to her right breast completed on 04/12/17.   Pt presents to the office today accompanied by her husband. She reports that she is doing well overall. She reports the right breast is swollen and feels hard since her last aspiration of fluid from the breast on 04/12/17. She states she feels as if the symptoms from the fluid in the breast are slowly improving. Her last physical therapy appointment was about 2-3 months ago and only had one appointment. She states she has stopped smoking cigarettes completely.   On review of systems, swelling and firmness of the right breast. She reports increased appetite but is likely from not smoking anymore. Patient reports increased fatigue.   Husband translated for the patient encounter today.                             ALLERGIES:  is allergic to no known allergies.  Meds: Current Outpatient Prescriptions  Medication Sig Dispense Refill  . letrozole (FEMARA) 2.5 MG tablet Take 1 tablet (2.5 mg total) by mouth  daily. 30 tablet 2  . olmesartan-hydrochlorothiazide (BENICAR HCT) 20-12.5 MG tablet Take 1 tablet by mouth daily.    Marland Kitchen HYDROcodone-acetaminophen (NORCO/VICODIN) 5-325 MG tablet Take 1-2 tablets by mouth every 6 (six) hours as needed for moderate pain. (Patient not taking: Reported on 06/14/2017) 20 tablet 0   No current facility-administered medications for this encounter.     Physical Findings: The patient is in no acute distress. Patient is alert and oriented.  weight is 210 lb 9.6 oz (95.5 kg). Her temperature is 98.3 F (36.8 C). Her blood pressure is 128/75 and her pulse is 73. Her oxygen saturation is 98%. .    General: Alert and oriented, in no acute distress HEENT: Head is normocephalic. Extraocular movements are intact. Skin: No concerning lesions. Psychiatric: Judgment and insight are intact. Affect is appropriate. Breast: Right breast firm, smaller than left breast. Erythema has mostly resolved. There is a large central seroma in the breast.   Lab Findings: Lab Results  Component Value Date   WBC 6.3 01/18/2017   HGB 12.6 01/18/2017   HCT 37.4 01/18/2017   MCV 91.4 01/18/2017   PLT 259 01/18/2017    Radiographic Findings: No results found.  Impression/Plan: Healing from treatment. Breast discomfort.   I encouraged her to continue with yearly mammography and followup with medical oncology. I will see her back on an as-needed basis. I have encouraged her to call if she has any issues or concerns in the future.  Recommended physical therapy and will set up appointment with Leone Payor, as patient wants a second opinion regarding therapy for breast discomfort/seroma. Referral made.  I wished her the very best.    _____________________________________   Eppie Gibson, MD   This document serves as a record of services personally performed by Eppie Gibson, MD, MD. It was created on her behalf by Marlowe Kays, a trained medical scribe. The creation of this record is  based on the scribe's personal observations and the provider's statements to them. This document has been checked and approved by the attending provider.

## 2017-06-14 NOTE — Progress Notes (Signed)
No show  This encounter was created in error - please disregard.

## 2017-06-17 ENCOUNTER — Telehealth: Payer: Self-pay | Admitting: Hematology

## 2017-06-17 NOTE — Telephone Encounter (Signed)
Scheduled appt per 10/5 sch message - patient said they will f/u with [facility] appt - scheduled lab and f.u in 3 months per message - patient husband said he will stop by scheduling the next visit to pick up an updated schedule.

## 2017-06-18 ENCOUNTER — Telehealth: Payer: Self-pay | Admitting: *Deleted

## 2017-06-18 NOTE — Telephone Encounter (Signed)
CALLED PATIENT TO INFORM OF PT APPT. ON 06-24-17 - ARRIVAL TIME - 12:50 PM @ [facility], LVM FOR A RETURN CALL

## 2017-06-24 ENCOUNTER — Ambulatory Visit: Payer: Medicare Other | Admitting: Physical Therapy

## 2017-07-04 ENCOUNTER — Encounter: Payer: Medicare Other | Admitting: Adult Health

## 2017-09-04 ENCOUNTER — Encounter (HOSPITAL_COMMUNITY): Payer: Self-pay | Admitting: Emergency Medicine

## 2017-09-04 ENCOUNTER — Emergency Department (HOSPITAL_COMMUNITY): Payer: Medicare Other

## 2017-09-04 ENCOUNTER — Other Ambulatory Visit: Payer: Self-pay

## 2017-09-04 ENCOUNTER — Emergency Department (HOSPITAL_COMMUNITY)
Admission: EM | Admit: 2017-09-04 | Discharge: 2017-09-04 | Disposition: A | Discharge status: Home / Self Care | Payer: Medicare Other | Attending: Physician Assistant | Admitting: Physician Assistant

## 2017-09-04 DIAGNOSIS — L539 Erythematous condition, unspecified: Secondary | ICD-10-CM | POA: Diagnosis present

## 2017-09-04 DIAGNOSIS — Z853 Personal history of malignant neoplasm of breast: Secondary | ICD-10-CM | POA: Diagnosis not present

## 2017-09-04 DIAGNOSIS — N99842 Postprocedural seroma of a genitourinary system organ or structure following a genitourinary system procedure: Secondary | ICD-10-CM | POA: Insufficient documentation

## 2017-09-04 DIAGNOSIS — F17210 Nicotine dependence, cigarettes, uncomplicated: Secondary | ICD-10-CM | POA: Diagnosis not present

## 2017-09-04 DIAGNOSIS — I10 Essential (primary) hypertension: Secondary | ICD-10-CM | POA: Insufficient documentation

## 2017-09-04 DIAGNOSIS — N611 Abscess of the breast and nipple: Secondary | ICD-10-CM | POA: Insufficient documentation

## 2017-09-04 LAB — COMPREHENSIVE METABOLIC PANEL
ALK PHOS: 86 U/L (ref 38–126)
ALT: 19 U/L (ref 14–54)
AST: 16 U/L (ref 15–41)
Albumin: 3.6 g/dL (ref 3.5–5.0)
Anion gap: 12 (ref 5–15)
BILIRUBIN TOTAL: 0.4 mg/dL (ref 0.3–1.2)
BUN: 11 mg/dL (ref 6–20)
CALCIUM: 9.5 mg/dL (ref 8.9–10.3)
CO2: 23 mmol/L (ref 22–32)
CREATININE: 0.65 mg/dL (ref 0.44–1.00)
Chloride: 101 mmol/L (ref 101–111)
GFR calc non Af Amer: >60 mL/min (ref >60)
Glucose, Bld: 213 mg/dL — ABNORMAL HIGH (ref 65–99)
Potassium: 3.8 mmol/L (ref 3.5–5.1)
Sodium: 136 mmol/L (ref 135–145)
TOTAL PROTEIN: 7.7 g/dL (ref 6.5–8.1)

## 2017-09-04 LAB — I-STAT CG4 LACTIC ACID, ED
LACTIC ACID, VENOUS: 1.96 mmol/L — AB (ref 0.5–1.9)
LACTIC ACID, VENOUS: 1.38 mmol/L (ref 0.5–1.9)

## 2017-09-04 LAB — CBC WITH DIFFERENTIAL/PLATELET
BASOS PCT: 1 %
Basophils Absolute: 0 10*3/uL (ref 0.0–0.1)
EOS ABS: 0.2 10*3/uL (ref 0.0–0.7)
EOS PCT: 2 %
HCT: 36.9 % (ref 36.0–46.0)
HEMOGLOBIN: 12.4 g/dL (ref 12.0–15.0)
Lymphocytes Relative: 18 %
Lymphs Abs: 1.4 10*3/uL (ref 0.7–4.0)
MCH: 30.5 pg (ref 26.0–34.0)
MCHC: 33.6 g/dL (ref 30.0–36.0)
MCV: 90.9 fL (ref 78.0–100.0)
Monocytes Absolute: 0.8 10*3/uL (ref 0.1–1.0)
Monocytes Relative: 11 %
NEUTROS PCT: 68 %
Neutro Abs: 5.2 10*3/uL (ref 1.7–7.7)
PLATELETS: 312 10*3/uL (ref 150–400)
RBC: 4.06 MIL/uL (ref 3.87–5.11)
RDW: 12.6 % (ref 11.5–15.5)
WBC: 7.5 10*3/uL (ref 4.0–10.5)

## 2017-09-04 MED ORDER — VANCOMYCIN HCL IN DEXTROSE 1-5 GM/200ML-% IV SOLN
1000.0000 mg | Freq: Two times a day (BID) | INTRAVENOUS | Status: DC
  Administered 2017-09-04: 1000 mg via INTRAVENOUS
  Filled 2017-09-04: qty 200

## 2017-09-04 MED ORDER — SULFAMETHOXAZOLE-TRIMETHOPRIM 800-160 MG PO TABS: 1.0000 | ORAL_TABLET | Freq: Two times a day (BID) | ORAL | 0 refills | Status: AC

## 2017-09-04 MED ORDER — SULFAMETHOXAZOLE-TRIMETHOPRIM 800-160 MG PO TABS
1.0000 | ORAL_TABLET | Freq: Once | ORAL | Status: AC
  Administered 2017-09-04: 1 via ORAL
  Filled 2017-09-04: qty 1

## 2017-09-04 NOTE — ED Provider Notes (Signed)
[facility] EMERGENCY DEPARTMENT Provider Note   CSN: [id number] Arrival date & time: 09/04/17  1414     History   Chief Complaint Chief Complaint  Patient presents with  . Breast Pain  . Cellulitis    HPI Crystal Logan is a 66 y.o. female.  HPI   Patient is a very pleasant 66 year old female presenting with right breast erythema and swelling.  Patient has history of breast cancer on the right.  Patient had multiple incidents incision and drainages for infection afterwards.  These have been completed by Dr. Lucia Gaskins.  Patient reports that her erythema started on Friday.  She has had fever chills.  Temperature 101.  Patient has drainage of fluid with exquisite tenderness to palpation.  Past Medical History:  Diagnosis Date  . Breast cancer ([place]) 12/2016   right  . History of radiation therapy 02/13/17- 03/20/17   Right Breast treated to 50 Gy in 25 fractions  . Hypertension    states under control with med., has been on med. ~ 15 yr.    Patient Active Problem List   Diagnosis Date Noted  . Malignant neoplasm of upper-inner quadrant of right breast in female, estrogen receptor positive ([place]) 11/30/2016    Past Surgical History:  Procedure Laterality Date  . BREAST LUMPECTOMY WITH RADIOACTIVE SEED AND SENTINEL LYMPH NODE BIOPSY Right 01/02/2017   Procedure: RIGHT BREAST LUMPECTOMY WITH RADIOACTIVE SEED AND RIGHT AXILLARY SENTINEL LYMPH NODE BIOPSY;  Surgeon: Alphonsa Overall, MD;  Location: [facility];  Service: General;  Laterality: Right;  . RE-EXCISION OF BREAST LUMPECTOMY Right 01/18/2017   Procedure: RE-EXCISION OF BREAST LUMPECTOMY;  Surgeon: Alphonsa Overall, MD;  Location: [facility];  Service: General;  Laterality: Right;  . TONSILLECTOMY      OB History    No data available       Home Medications    Prior to Admission medications   Medication Sig Start Date End Date Taking? Authorizing Provider  HYDROcodone-acetaminophen  (NORCO/VICODIN) 5-325 MG tablet Take 1-2 tablets by mouth every 6 (six) hours as needed for moderate pain. 01/18/17  Yes Alphonsa Overall, MD  olmesartan-hydrochlorothiazide (BENICAR HCT) 20-12.5 MG tablet Take 1 tablet by mouth daily.   Yes [provider]  letrozole (FEMARA) 2.5 MG tablet Take 1 tablet (2.5 mg total) by mouth daily. Patient not taking: Reported on 09/04/2017 03/27/17   Truitt Merle, MD    Family History History reviewed. No pertinent family history.  Social History Social History   Tobacco Use  . Smoking status: Current Every Day Smoker    Packs/day: 0.25    Years: 47.00    Pack years: 11.75    Types: Cigarettes  . Smokeless tobacco: Never Used  Substance Use Topics  . Alcohol use: Yes    Comment: occasionally  . Drug use: No     Allergies   No known allergies   Review of Systems Review of Systems  Constitutional: Positive for fever. Negative for activity change.  Respiratory: Negative for shortness of breath.   Cardiovascular: Negative for chest pain.  Gastrointestinal: Negative for abdominal pain.  All other systems reviewed and are negative.    Physical Exam Updated Vital Signs BP (!) 189/96 (BP Location: Right Arm)   Pulse 85   Temp 100 F (37.8 C) (Oral)   Resp 18   SpO2 100%   Physical Exam  Constitutional: She is oriented to person, place, and time. She appears well-developed and well-nourished.  HENT:  Head: Normocephalic and atraumatic.  Eyes: Right eye exhibits no discharge. Left eye exhibits no discharge.  Cardiovascular: Normal rate, regular rhythm and normal heart sounds.  No murmur heard. Pulmonary/Chest: Effort normal and breath sounds normal. She has no wheezes. She has no rales.  Patient's right breast with erythema induration drainage from the nipple.  Picture below.  Abdominal: Soft. She exhibits no distension. There is no tenderness.  Neurological: She is oriented to person, place, and time.  Skin: Skin is warm and  dry. She is not diaphoretic.  Psychiatric: She has a normal mood and affect.  Nursing note and vitals reviewed.      ED Treatments / Results  Labs (all labs ordered are listed, but only abnormal results are displayed) Labs Reviewed  COMPREHENSIVE METABOLIC PANEL - Abnormal; Notable for the following components:      Result Value   Glucose, Bld 213 (*)    All other components within normal limits  I-STAT CG4 LACTIC ACID, ED - Abnormal; Notable for the following components:   Lactic Acid, Venous 1.96 (*)    All other components within normal limits  CBC WITH DIFFERENTIAL/PLATELET  I-STAT CG4 LACTIC ACID, ED    EKG  EKG Interpretation None       Radiology No results found.  Procedures Procedures (including critical care time)  Medications Ordered in ED Medications - No data to display   Initial Impression / Assessment and Plan / ED Course  I have reviewed the triage vital signs and the nursing notes.  Pertinent labs & imaging results that were available during my care of the patient were reviewed by me and considered in my medical decision making (see chart for details).     Patient is a very pleasant 66 year old female presenting with right breast erythema and swelling.  Patient has history of breast cancer on the right.  Patient had multiple incidents incision and drainages for infection afterwards.  These have been completed by Dr. Lucia Gaskins.  Patient reports that her erythema started on Friday.  She has had fever chills.  Temperature 101.  Patient has drainage of fluid with exquisite tenderness to palpation.  4:44 PM Believe this is a reinfection of the previous infection in the same site.  Will consult general surgery, on-call for Dr. Lucia Gaskins. Breast US ordered.   Surgeon saw patietn, needle drainage.  Pt requesting to go home. Will start abx, have her follow up with surgery within the next week.   Final Clinical Impressions(s) / ED Diagnoses   Final diagnoses:    None    ED Discharge Orders    None       Macarthur Critchley, MD 09/04/17 2316

## 2017-09-04 NOTE — ED Notes (Signed)
Patient currently at ultrasound .  

## 2017-09-04 NOTE — Discharge Instructions (Signed)
Please take these antibiotics.  Please return immediately if you have any increasing pain, fever, or other concerns.  Follow-up with your surgeon within 5 days.

## 2017-09-04 NOTE — ED Triage Notes (Signed)
Pt to ER for evaluation of right breast pain, swelling, redness, and warmth x5 days. States feels "hot and cold chills," unsure of fevers. Breast is very reddened, indurated, and warm to touch. Pt in NAD. Has ice pack in bra at this time.

## 2017-09-04 NOTE — Consult Note (Signed)
Surgical Consultation Requesting provider:Dr. Thomasene Lot  CC: Right breast swelling and pain  HPI: Very nice 66 year old woman here with her husband with right breast swelling, pain, and redness which began on Friday of last week. She is status post lumpectomy and radiation for right-sided breast cancer in her postop course was complicated by seroma in the right breast and has required aspiration of right breast seroma at least twice, most recently in September in Dr. Pollie Friar office. On Friday last week she began to have pain and swelling in the right breast along with redness. Her husband states that she did not have any fevers. They tended to come to our office on Monday but as it was Christmas Eve, there was no one there. They went and saw her primary care doctor who urged him to return to our office, but again we were not they're so they have come to the emergency room today.  Allergies  Allergen Reactions  . No Known Allergies     Past Medical History:  Diagnosis Date  . Breast cancer ([place]) 12/2016   right  . History of radiation therapy 02/13/17- 03/20/17   Right Breast treated to 50 Gy in 25 fractions  . Hypertension    states under control with med., has been on med. ~ 15 yr.    Past Surgical History:  Procedure Laterality Date  . BREAST LUMPECTOMY WITH RADIOACTIVE SEED AND SENTINEL LYMPH NODE BIOPSY Right 01/02/2017   Procedure: RIGHT BREAST LUMPECTOMY WITH RADIOACTIVE SEED AND RIGHT AXILLARY SENTINEL LYMPH NODE BIOPSY;  Surgeon: Alphonsa Overall, MD;  Location: [facility];  Service: General;  Laterality: Right;  . RE-EXCISION OF BREAST LUMPECTOMY Right 01/18/2017   Procedure: RE-EXCISION OF BREAST LUMPECTOMY;  Surgeon: Alphonsa Overall, MD;  Location: [facility];  Service: General;  Laterality: Right;  . TONSILLECTOMY      History reviewed. No pertinent family history.  Social History   Socioeconomic History  . Marital status: Married    Spouse name: None  . Number of  children: None  . Years of education: None  . Highest education level: None  Social Needs  . Financial resource strain: None  . Food insecurity - worry: None  . Food insecurity - inability: None  . Transportation needs - medical: None  . Transportation needs - non-medical: None  Occupational History  . None  Tobacco Use  . Smoking status: Current Every Day Smoker    Packs/day: 0.25    Years: 47.00    Pack years: 11.75    Types: Cigarettes  . Smokeless tobacco: Never Used  Substance and Sexual Activity  . Alcohol use: Yes    Comment: occasionally  . Drug use: No  . Sexual activity: None  Other Topics Concern  . None  Social History Narrative  . None    No current facility-administered medications on file prior to encounter.    Current Outpatient Medications on File Prior to Encounter  Medication Sig Dispense Refill  . HYDROcodone-acetaminophen (NORCO/VICODIN) 5-325 MG tablet Take 1-2 tablets by mouth every 6 (six) hours as needed for moderate pain. 20 tablet 0  . olmesartan-hydrochlorothiazide (BENICAR HCT) 20-12.5 MG tablet Take 1 tablet by mouth daily.    Marland Kitchen letrozole (FEMARA) 2.5 MG tablet Take 1 tablet (2.5 mg total) by mouth daily. (Patient not taking: Reported on 09/04/2017) 30 tablet 2    Review of Systems: a complete, 10pt review of systems was completed with pertinent positives and negatives as documented in the HPI  Physical  Exam: Vitals:   09/04/17 1815 09/04/17 1938  BP:  (!) 146/91  Pulse: 73 84  Resp:  16  Temp:  98.2 F (36.8 C)  SpO2: 97% 98%   Gen: A&Ox3, no distress  Head: normocephalic, atraumatic, EOMI, anicteric.  Chest: unlabored respirations, symmetrical air entry Cardiovascular: RRR with palpable distal pulses, no pedal edema Abdomen: soft, nondistended, nontender. No mass or organomegaly.  Extremities: warm, without edema, no deformities  Neuro: grossly intact Psych: appropriate mood and affect  Skin: warm and dry Breast: There is  erythema, warmth and tenderness of the majority of the right breast with serous drainage through the nipple. Palpable seroma confirmed on ultrasound.    CBC Latest Ref Rng & Units 09/04/2017 01/18/2017 01/10/2017  WBC 4.0 - 10.5 K/uL 7.5 6.3 8.7  Hemoglobin 12.0 - 15.0 g/dL 12.4 12.6 13.2  Hematocrit 36.0 - 46.0 % 36.9 37.4 39.6  Platelets 150 - 400 K/uL 312 259 284    CMP Latest Ref Rng & Units 09/04/2017 01/18/2017 01/10/2017  Glucose 65 - 99 mg/dL 213(H) 102(H) 104  BUN 6 - 20 mg/dL 11 12 12.0  Creatinine 0.44 - 1.00 mg/dL 0.65 0.66 0.8  Sodium 135 - 145 mmol/L 136 138 139  Potassium 3.5 - 5.1 mmol/L 3.8 4.0 4.2  Chloride 101 - 111 mmol/L 101 107 -  CO2 22 - 32 mmol/L 23 22 26   Calcium 8.9 - 10.3 mg/dL 9.5 8.8(L) 9.6  Total Protein 6.5 - 8.1 g/dL 7.7 - 7.2  Total Bilirubin 0.3 - 1.2 mg/dL 0.4 - 0.38  Alkaline Phos 38 - 126 U/L 86 - 89  AST 15 - 41 U/L 16 - 12  ALT 14 - 54 U/L 19 - 28    No results found for: INR, PROTIME  Imaging: Breast US read pending, clearly fluid collection present  A/P: A 66 year old woman with recurrent right breast seroma status post lumpectomy radiation treatment. With the prior radiation it is difficult to know what the baseline status of her skin is is seems that she did have some trouble with her skin based on a brief look at Dr. Pollie Friar notes. However with the erythema, pain and warmth, I'm concerned about infection of the seroma. We'll perform bedside aspiration. The patient does not want to be admitted to the hospital. If aspiration yields clear seroma fluid, will try a course of oral antibiotics with close follow-up with Dr. Lucia Gaskins.   Romana Juniper, MD [facility] Surgery, [place] Pager [phone]

## 2017-09-04 NOTE — Progress Notes (Signed)
Pharmacy Antibiotic Note  Crystal Logan is a 66 y.o. female admitted on 09/04/2017 with cellulitis.    Plan: Vancomycin 1g q12h Monitor renal fx vt prn  Weight: 210 lb 8.6 oz (95.5 kg)  Temp (24hrs), Avg:100 F (37.8 C), Min:100 F (37.8 C), Max:100 F (37.8 C)  Recent Labs  Lab 09/04/17 1503 09/04/17 1520 09/04/17 1713  WBC 7.5  --   --   CREATININE 0.65  --   --   LATICACIDVEN  --  1.96* 1.38    Estimated Creatinine Clearance: 77.5 mL/min (by C-G formula based on SCr of 0.65 mg/dL).    Allergies  Allergen Reactions  . No Known Allergies    Levester Fresh, PharmD, BCPS, BCCCP Clinical Pharmacist Clinical phone for 09/04/2017 from 7a-3:30p: [phone] If after 3:30p, please call main pharmacy at: [phone] 09/04/2017 5:25 PM

## 2017-09-04 NOTE — Procedures (Signed)
Procedure Note  Crystal Logan  [id number]  [id number]  09/04/2017   Surgeon: Vikki Ports A ConnorMD  Procedure performed: Percutaneous aspiration right breast seroma  Preop diagnosis: Right breast seroma, recurrent Post-op diagnosis/intraop findings: same  Specimens: Seroma fluid collected for culture Retained items: no EBL: 0 cc Complications: none  Description of procedure: Verbal informed consent was obtained. The skin of the inferior right breast was prepped with alcohol and 18-gauge needle was inserted through the skin into the seroma. I was able to aspirate approximately 75 mL, 5 mL of which was collected in a small syringe for culture. The fluid was clear and straw-colored and did not appear infected. The patient will be discharged from the emergency room on oral antibiotics with close follow-up with Dr. Lucia Gaskins. Strict precautions were given to return if the erythema or pain persist or worsen and especially if the patient develops fevers.

## 2017-09-08 LAB — BODY FLUID CULTURE

## 2017-09-09 ENCOUNTER — Telehealth: Payer: Self-pay | Admitting: Emergency Medicine

## 2017-09-09 NOTE — Telephone Encounter (Signed)
Post ED Visit - Positive Culture Follow-up  Culture report reviewed by antimicrobial stewardship pharmacist:  []  Elenor Quinones, Pharm.D. []  Heide Guile, Pharm.D., BCPS AQ-ID [x]  Parks Neptune, Pharm.D., BCPS []  Alycia Rossetti, Pharm.D., BCPS []  [place], [place].D., BCPS, AAHIVP []  Legrand Como, Pharm.D., BCPS, AAHIVP []  Salome Arnt, PharmD, BCPS []  Dimitri Ped, PharmD, BCPS []  Vincenza Hews, PharmD, BCPS  Positive wound culture Treated with sulfamethoxazole-trimethoprim, organism sensitive to the same and no further patient follow-up is required at this time.  Hazle Nordmann 09/09/2017, 12:39 PM

## 2017-09-16 ENCOUNTER — Other Ambulatory Visit: Payer: Medicare Other

## 2017-09-16 ENCOUNTER — Ambulatory Visit: Payer: Medicare Other | Admitting: Hematology

## 2017-09-23 ENCOUNTER — Telehealth: Payer: Self-pay | Admitting: Hematology

## 2017-09-23 NOTE — Telephone Encounter (Signed)
Spoke to patients husband regarding upcoming January appointments. Patients husband stated his wife will no longer be a patient of Dr Burr Medico.

## 2017-10-03 ENCOUNTER — Ambulatory Visit: Payer: Medicare Other | Admitting: Nurse Practitioner

## 2017-10-03 ENCOUNTER — Other Ambulatory Visit: Payer: Medicare Other

## 2017-10-07 ENCOUNTER — Telehealth: Payer: Self-pay | Admitting: *Deleted

## 2017-10-07 ENCOUNTER — Telehealth: Payer: Self-pay | Admitting: Hematology

## 2017-10-07 MED ORDER — LETROZOLE 2.5 MG PO TABS: 2.5000 mg | ORAL_TABLET | Freq: Every day | ORAL | 4 refills | Status: DC

## 2017-10-07 NOTE — Telephone Encounter (Addendum)
Received vm call from husband requesting refill on pt's med.  Returned call & he need refill on letrazole for wife.  He states she is out.  Pt doesn't have f/u appt & was last seen in July 2018 & 09/23/17 note says husband states that she will not see Dr Burr Medico anymore.  Discussed with husband & he states that it cost too much money to come it to see MD & they are poor & can not afford.  Informed that pt needs to be monitored on this drug.  He expresses that he does not understand & would like [place] to send in refill.  Informed that this would be discussed with Dr Burr Medico.  Talked with Dr Burr Medico & then called pt & informed that it is unsafe to prescribe this med & not be seen or monitored.  She suggest that pt return in 3 months & as long as she is being seen by PCP she can minimally see pt every year.  Husband agreed with plan & message sent to scheduler.

## 2017-10-07 NOTE — Telephone Encounter (Signed)
Spoke with patients husband regarding appointment per 1/28 sched msg

## 2018-01-03 ENCOUNTER — Ambulatory Visit: Payer: Medicare Other | Admitting: Hematology

## 2018-01-03 ENCOUNTER — Other Ambulatory Visit: Payer: Medicare Other

## 2018-02-04 ENCOUNTER — Other Ambulatory Visit: Payer: Self-pay | Admitting: Surgery

## 2018-02-04 DIAGNOSIS — Z9889 Other specified postprocedural states: Secondary | ICD-10-CM

## 2018-02-04 NOTE — Progress Notes (Signed)
[place]  [phone] [phone]  Clinic Follow-Up Note   Patient Care Team: Jilda Panda, MD as PCP - General (Internal Medicine) Alphonsa Overall, MD as Consulting Physician (General Surgery) Truitt Merle, MD as Consulting Physician (Hematology) Eppie Gibson, MD as Attending Physician (Radiation Oncology) Gardenia Phlegm, NP as Nurse Practitioner (Hematology and Oncology)   Date of Service:  02/05/2018  CHIEF COMPLAINTS:  Follow up right breast cancer  Oncology History   Cancer Staging Malignant neoplasm of upper-inner quadrant of right breast in female, estrogen receptor positive ([place]) Staging form: Breast, AJCC 8th Edition - Clinical stage from 11/27/2016: Stage IA (cT1c, cN0, cM0, G2, ER: Positive, PR: Positive, HER2: Negative) - Signed by Truitt Merle, MD on 12/05/2016       Malignant neoplasm of upper-inner quadrant of right breast in female, estrogen receptor positive ([place])   11/16/2016 Mammogram    Diagnostic mammogram and US showed suspicious asymmetry/distortion within the inner right breast, 3 o'clock axis region, at middle depth, without definite sonographic Correlated, axilla (-)      11/27/2016 Receptors her2    ER 90%+ PR 40%+ HER2 - Ki67 5%      11/27/2016 Initial Biopsy    Breast, right, needle core biopsy, 3:00 o'clock - INVASIVE LOBULAR CARCINOMA, G1-2      11/27/2016 Initial Diagnosis    Malignant neoplasm of upper-inner quadrant of right breast in female, estrogen receptor positive ([place])      12/10/2016 Imaging    MR Breast Bilateral IMPRESSION: 1. Post biopsy changes and associated non masslike enhancement in the medial aspect of the right breast. 2. Left breast is negative. 3. No MRI evidence for adenopathy.      12/26/2016 Procedure    Colonoscopy  - Three 3 to 6 mm polyps in the descending colon, removed with a cold snare. Resected and retrieved. - The examination was otherwise normal on direct and  retroflexion views.      12/26/2016 Pathology Results    Diagnosis Surgical [P], descending, polyps (3) -TUBULAR ADENOMAS. -NO HIGH GRADE DYSPLASIA OR MALIGNANCY IDENTIFIED.      01/02/2017 Surgery    Right Breast lumpectomy with sentinel lymph node biopsy performed by Dr. Lucia Gaskins.      01/02/2017 Pathology Results    Diagnosis  1. Breast, lumpectomy, Right - INVASIVE LOBULAR CARCINOMA, 2.8 CM. - INVASIVE CARCINOMA BROADLY INVOLVES INFERIOR MARGIN. - PREVIOUS BIOPSY SITE. - FOCAL LYMPH VASCULAR INVOLVEMENT BY TUMOR. 2. Lymph node, sentinel, biopsy, Right Axillary - METASTATIC CARCINOMA IN ONE LYMPH NODE WITH EXTRANODAL EXTENSION (1/1).      01/18/2017 Surgery    RE-EXCISION OF BREAST LUMPECTOMY by Dr. Lucia Gaskins       01/18/2017 Pathology Results    Diagnosis 01/18/17 Breast, excision, Right Inferior Margin - ATYPICAL DUCTAL HYPERPLASIA. - FIBROCYSTIC CHANGES WITH CALCIFICATIONS. - VASCULAR CALCIFICATIONS. - SEE COMMENT. Microscopic Comment The surgical resection margin(s) of the specimen were inked and microscopically evaluated        - 03/20/2017 Radiation Therapy    Radiation by Dr. Isidore Moos       04/2017 -  Anti-estrogen oral therapy    Letrozole daily        HISTORY OF PRESENTING ILLNESS (12/05/2016):  Crystal Logan 67 y.o. female who presented for routine screening mammogram on 10/12/2016 and was found to have possible right breast asymmetry. Accordingly the patient underwent diagnostic mammogram and right breast ultrasound on 11/16/2016. These scans showed persistent asymmetry within the inner right breast, 3 o'clock  axis region, at middle depth, with associated architectural distortion. Biopsy of the right breast on 11/27/2016 revealed invasive mammary carcinoma, grade 1-2 / ER 90% / PR 40% / HER-2 negative / Ki67 5%.   The patient's case was presented to our multidisciplinary tumor board and she is seen in breast clinic today. She is here for further evaluation and  treatment recommendations.   Crystal Logan is doing well overall. Our visit was facilitated by the patient's husband who translated throughout the visit. She has never had surgery before and is nervous about the procedure. She did not self palpate the breast mass. This was her first screening mammogram. She injured her breast three years ago and had intermittent pain in the breast since. This pain was worse with change in the weather. She reports a palpable lump post-biopsy. She denies pain at the biopsy site. She denies joint pain. She has no other major health issues. She has no family history of cancer. She smokes about 6-7 cigarettes daily. She has been smoking since 67 years-old. She did not experience hot flashes with menopause. She has not had a bone density scan.  GYN HISTORY  Menarchal: 89 or 67 years-old LMP: Around 67 years-old Contraceptive: None HRT: None GP: 1  CURRENT THERAPY: Letrozole once daily for 7 years starting 04/10/17  INTERVAL HISTORY:   Crystal Logan returns to the clinic today for routine follow up. She was last seen by me in 03/2017. Sice the last visit she presented to the ED on 09/04/17 for cellulitis and abscess of the right breast, which required antibiotics and drainage.   She presents to the clinic today with her husband who translates for her. He notes he had an appointment with Dr. Lucia Gaskins who said she was doing well. She is to see her every 6 months. She notes her tooth started to bleed and has pain still. She has not been seen by dentist. She notes she has trigger finger on her right thumb with some pain. She plans to see her PCP in July.   He notes last year he had a lot of medical bills to pay. So now they pay monthly until they pay off everything.    On review of symptoms, pt notes tooth pain in upper left teeth. She has trigger finger in her right thumb occasionally. She denies having hot flashes.     MEDICAL HISTORY:  Past Medical History:  Diagnosis  Date  . Breast cancer ([place]) 12/2016   right  . History of radiation therapy 02/13/17- 03/20/17   Right Breast treated to 50 Gy in 25 fractions  . Hypertension    states under control with med., has been on med. ~ 15 yr.    SURGICAL HISTORY: Past Surgical History:  Procedure Laterality Date  . BREAST LUMPECTOMY WITH RADIOACTIVE SEED AND SENTINEL LYMPH NODE BIOPSY Right 01/02/2017   Procedure: RIGHT BREAST LUMPECTOMY WITH RADIOACTIVE SEED AND RIGHT AXILLARY SENTINEL LYMPH NODE BIOPSY;  Surgeon: Alphonsa Overall, MD;  Location: [facility];  Service: General;  Laterality: Right;  . RE-EXCISION OF BREAST LUMPECTOMY Right 01/18/2017   Procedure: RE-EXCISION OF BREAST LUMPECTOMY;  Surgeon: Alphonsa Overall, MD;  Location: [facility];  Service: General;  Laterality: Right;  . TONSILLECTOMY      SOCIAL HISTORY: Social History   Socioeconomic History  . Marital status: Married    Spouse name: Not on file  . Number of children: Not on file  . Years of education: Not on file  .  Highest education level: Not on file  Occupational History  . Not on file  Social Needs  . Financial resource strain: Not on file  . Food insecurity:    Worry: Not on file    Inability: Not on file  . Transportation needs:    Medical: Not on file    Non-medical: Not on file  Tobacco Use  . Smoking status: Current Every Day Smoker    Packs/day: 0.25    Years: 47.00    Pack years: 11.75    Types: Cigarettes  . Smokeless tobacco: Never Used  Substance and Sexual Activity  . Alcohol use: Yes    Comment: occasionally  . Drug use: No  . Sexual activity: Not on file  Lifestyle  . Physical activity:    Days per week: Not on file    Minutes per session: Not on file  . Stress: Not on file  Relationships  . Social connections:    Talks on phone: Not on file    Gets together: Not on file    Attends religious service: Not on file    Active member of club or organization: Not on file    Attends meetings of  clubs or organizations: Not on file    Relationship status: Not on file  . Intimate partner violence:    Fear of current or ex partner: Not on file    Emotionally abused: Not on file    Physically abused: Not on file    Forced sexual activity: Not on file  Other Topics Concern  . Not on file  Social History Narrative  . Not on file    FAMILY HISTORY: History reviewed. No pertinent family history.  ALLERGIES:  is allergic to no known allergies.  MEDICATIONS:  Current Outpatient Medications  Medication Sig Dispense Refill  . HYDROcodone-acetaminophen (NORCO/VICODIN) 5-325 MG tablet Take 1-2 tablets by mouth every 6 (six) hours as needed for moderate pain. 20 tablet 0  . letrozole (FEMARA) 2.5 MG tablet Take 1 tablet (2.5 mg total) by mouth daily. 30 tablet 5  . olmesartan-hydrochlorothiazide (BENICAR HCT) 20-12.5 MG tablet Take 1 tablet by mouth daily.     No current facility-administered medications for this visit.     REVIEW OF SYSTEMS:   Constitutional: Denies fevers, chills or abnormal night sweats Eyes: Denies blurriness of vision, double vision or watery eyes Ears, nose, mouth, throat, and face: Denies mucositis or sore throat (+) Upper left gum pain Respiratory: Denies cough, dyspnea or wheezes Cardiovascular: Denies palpitation, chest discomfort or lower extremity swelling Gastrointestinal:  Denies nausea, heartburn or change in bowel habits MSK: (+) Trigger finger in right thumb occasionally  Skin: Denies abnormal skin rashes   Lymphatics: Denies new lymphadenopathy or easy bruising Neurological:Denies numbness, tingling or new weaknesses Behavioral/Psych: Mood is stable, no new changes  All other systems were reviewed with the patient and are negative.  PHYSICAL EXAMINATION: ECOG PERFORMANCE STATUS: 0 - Asymptomatic  Vitals:   02/05/18 0859  BP: (!) 147/71  Pulse: 70  Resp: 18  Temp: 97.6 F (36.4 C)  SpO2: 98%   Filed Weights   02/05/18 0859  Weight:  211 lb (95.7 kg)    GENERAL:alert, no distress and comfortable SKIN: skin color, texture, turgor are normal, no rashes or significant lesions EYES: normal, conjunctiva are pink and non-injected, sclera clear OROPHARYNX:no exudate, no erythema and lips, buccal mucosa, and tongue normal  NECK: supple, thyroid normal size, non-tender, without nodularity LYMPH:  no palpable lymphadenopathy in  the cervical, axillary or inguinal LUNGS: clear to auscultation and percussion with normal breathing effort HEART: regular rate & rhythm and no murmurs and no lower extremity edema ABDOMEN:abdomen soft, non-tender and normal bowel sounds Musculoskeletal:no cyanosis of digits and no clubbing  PSYCH: alert & oriented x 3 with fluent speech NEURO: no focal motor/sensory deficits BREAST: Right breast surgical incision is well healed with mild surgical scar and skin retraction. (+) mild skin erythema on lateral right breast but no ulcers or tenderness. Her right breast is only warm to the touch, appears to be smaller than left, her left breast exam was normal, no palpable axillary adenopathy   LABORATORY DATA:  I have reviewed the data as listed CBC Latest Ref Rng & Units 02/05/2018 09/04/2017 01/18/2017  WBC 3.9 - 10.3 K/uL 4.3 7.5 6.3  Hemoglobin 11.6 - 15.9 g/dL 13.3 12.4 12.6  Hematocrit 34.8 - 46.6 % 40.8 36.9 37.4  Platelets 145 - 400 K/uL 245 312 259    CMP Latest Ref Rng & Units 02/05/2018 09/04/2017 01/18/2017  Glucose 70 - 140 mg/dL 123 213(H) 102(H)  BUN 7 - 26 mg/dL 17 11 12   Creatinine 0.60 - 1.10 mg/dL 0.78 0.65 0.66  Sodium 136 - 145 mmol/L 140 136 138  Potassium 3.5 - 5.1 mmol/L 4.7 3.8 4.0  Chloride 98 - 109 mmol/L 106 101 107  CO2 22 - 29 mmol/L 24 23 22   Calcium 8.4 - 10.4 mg/dL 9.3 9.5 8.8(L)  Total Protein 6.4 - 8.3 g/dL 7.4 7.7 -  Total Bilirubin 0.2 - 1.2 mg/dL 0.4 0.4 -  Alkaline Phos 40 - 150 U/L 83 86 -  AST 5 - 34 U/L 18 16 -  ALT 0 - 55 U/L 24 19 -     PATHOLOGY  Diagnosis 01/18/17 Breast, excision, Right Inferior Margin - ATYPICAL DUCTAL HYPERPLASIA. - FIBROCYSTIC CHANGES WITH CALCIFICATIONS. - VASCULAR CALCIFICATIONS. - SEE COMMENT. Microscopic Comment The surgical resection margin(s) of the specimen were inked and microscopically evaluated   Diagnosis 01/02/17 1. Breast, lumpectomy, Right - INVASIVE LOBULAR CARCINOMA, 2.8 CM. - INVASIVE CARCINOMA BROADLY INVOLVES INFERIOR MARGIN. - PREVIOUS BIOPSY SITE. - FOCAL LYMPH VASCULAR INVOLVEMENT BY TUMOR. 2. Lymph node, sentinel, biopsy, Right Axillary - METASTATIC CARCINOMA IN ONE LYMPH NODE WITH EXTRANODAL EXTENSION (1/1). Microscopic Comment 1. BREAST, INVASIVE TUMOR Procedure: Localized lumpectomy and one sentinel lymph node. Laterality: Right breast. Tumor Size: 2.8 cm Histologic Type: Lobular Grade: I Tubular Differentiation: 3 Nuclear Pleomorphism: 1 Mitotic Count: 1 Ductal Carcinoma in Situ (DCIS): No Extent of Tumor: Skin: N/A Nipple: N/A Skeletal muscle: N/A Margins: Invasive carcinoma, distance from closest margin: Inferior margin broadly involved by invasive carcinoma. DCIS, distance from closest margin: N/A Regional Lymph Nodes: Number of Lymph Nodes Examined: 1 Number of Sentinel Lymph Nodes Examined: 1 Lymph Nodes with Macrometastases: 1 Lymph Nodes with Micrometastases: 0 Lymph Nodes with Isolated Tumor Cells: 0 1 of 3 FINAL for Logan, Crystal Logan ([id number]) Microscopic Comment(continued) Breast Prognostic Profile: Case # [id number] Estrogen Receptor: 90%, positive, strong staining Progesterone Receptor: 40%, positive, strong staining Her2: Negative, ratio 1.31 Ki-67: 5% Best tumor block for sendout testing: 1J Pathologic Stage Classification (pTNM, AJCC 8th Edition): Primary Tumor (pT): pT2 Regional Lymph Nodes (pN): pN1a Distant Metastases (pM): pMX (JDP:ecj 01/03/2017)  Diagnosis 12/26/16 Surgical [P], descending, polyps (3) -TUBULAR  ADENOMAS. -NO HIGH GRADE DYSPLASIA OR MALIGNANCY IDENTIFIED.  Diagnosis 11/27/2016 Breast, right, needle core biopsy, 3:00 o'clock - INVASIVE MAMMARY CARCINOMA. - SEE COMMENT. Microscopic Comment The carcinoma appears grade 1-2.  An E-Cadherin stain and a breast prognostic profile will be performed and the results reported separately. The results were called to [facility] on 11/28/16. (JBK:gt, 11/28/16) The malignant cells are negative for E-Cadherin, supporting a lobular phenotype. (JBK:kh 11/29/16) PROGNOSTIC INDICATORS Results: IMMUNOHISTOCHEMICAL AND MORPHOMETRIC ANALYSIS PERFORMED MANUALLY Estrogen Receptor: 90%, POSITIVE, STRONG STAINING INTENSITY Progesterone Receptor: 40%, POSITIVE, STRONG STAINING INTENSITY Proliferation Marker Ki67: 5% FLUORESCENCE IN-SITU HYBRIDIZATION Results: HER2 - NEGATIVE RATIO OF HER2/CEP17 SIGNALS 1.31 AVERAGE HER2 COPY NUMBER PER CELL 2.75   PROCEDURES Colonoscopy 12/26/16 - Three 3 to 6 mm polyps in the descending colon, removed with a cold snare. Resected and retrieved. - The examination was otherwise normal on direct and retroflexion views.  RADIOGRAPHIC STUDIES: I have personally reviewed the radiological images as listed and agreed with the findings in the report. No results found.  ASSESSMENT & PLAN:  67 y.o. post-menopausal female with   1. 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 -She underwent a second surgery for positive margins, which showed no evidence of malignancy.  She had a few episodes of right breast abscess after surgery, last one in 2018 -Patient will not consider adjuvant chemotherapy, onoctype was not sent. -she has completed RT 03/20/17.  -She started adjuvant letrozole in 04/2017, tolerating well.   -She is overdue for a mammogram, I will schedule one for 02/2018.  -continue Letrozole, refilled today.  -I encouraged patient and her husband to be compliant with follow ups as she needs to be monitored while on Letrozole. They understand.  -She is clinically doing well. Lab reviewed, her CBC and CMP are within normal limits. Her physical exam showed no current infection, but right breast still slightly warm to the touch. There is no clinical concern for recurrence. -She is at a higher risk for infection, I advised she watch out for signs of another abscess and contact her PCP, Dr. Lucia Gaskins or me.  -I encouraged her to stagger her appointments with me and Dr. Lucia Gaskins.  -I encouraged her to follow up with her PCP about her trigger finger and see a dentist for her gum pain.  -F/u in 8 months   2. Tobacco use - She has been smoking since 67 years-old. She previously smoked 1 ppd but has cut down. -We previously discussed smoking cessation -She has stopped smoking.   3. HTN -She'll continue medication and follow-up with her primary care physician  4. Bone Health  -We discussed his risk of osteopenia and osteoporosis from aromatase inhibitor -She has not had a bone density scan before, we'll obtain one this year as a baseline in 02/2018.    Plan: -Continue Letrozole, refilled today  -Diagnostic mammogram and bone density scan in June 2019 -Lab and f/u in 8 months, she will follow-up with Dr. Lucia Gaskins in 4-5 months     Orders Placed This Encounter  Procedures  . MM DIAG BREAST TOMO BILATERAL    Medicare/pf 10/12/16 bcg/no needs/hx of right breast ca with lumpectomy/no implants/af with pt husband  Epic Order    Standing Status:   Future    Standing Expiration Date:   02/06/2019    Order Specific Question:   Reason for Exam (SYMPTOM  OR DIAGNOSIS REQUIRED)    Answer:   screening    Order Specific Question:   Preferred imaging  location?    Answer:   [facility]  . DG Bone Density     Medicare/no needs/wt 211/NO CALCIUM/af with pt husband  Epic Order    Standing Status:   Future    Standing Expiration Date:   02/05/2019    Order Specific Question:   Reason for Exam (SYMPTOM  OR DIAGNOSIS REQUIRED)    Answer:   screening    Order Specific Question:   Preferred imaging location?    Answer:   [facility]    All questions were answered. The patient knows to call the clinic with any problems, questions or concerns.  I spent 20 minutes counseling the patient face to face. The total time spent in the appointment was 25 minutes and more than 50% was on counseling.  Oneal Deputy, am acting as scribe for Truitt Merle, MD.    I have reviewed the above documentation for accuracy and completeness, and I agree with the above.      Truitt Merle, MD 02/05/2018

## 2018-02-05 ENCOUNTER — Other Ambulatory Visit: Payer: Self-pay | Admitting: Hematology

## 2018-02-05 ENCOUNTER — Telehealth: Payer: Self-pay

## 2018-02-05 ENCOUNTER — Encounter: Payer: Self-pay | Admitting: Hematology

## 2018-02-05 ENCOUNTER — Inpatient Hospital Stay (HOSPITAL_BASED_OUTPATIENT_CLINIC_OR_DEPARTMENT_OTHER): Payer: Medicare Other | Admitting: Hematology

## 2018-02-05 ENCOUNTER — Inpatient Hospital Stay: Payer: Medicare Other | Attending: Hematology

## 2018-02-05 VITALS — BP 147/71 | HR 70 | Temp 97.6°F | Resp 18 | Ht 64.0 in | Wt 211.0 lb

## 2018-02-05 DIAGNOSIS — C50211 Malignant neoplasm of upper-inner quadrant of right female breast: Secondary | ICD-10-CM | POA: Diagnosis not present

## 2018-02-05 DIAGNOSIS — Z170 Estrogen receptor positive status [ER+]: Secondary | ICD-10-CM

## 2018-02-05 DIAGNOSIS — M65311 Trigger thumb, right thumb: Secondary | ICD-10-CM | POA: Diagnosis not present

## 2018-02-05 DIAGNOSIS — E2839 Other primary ovarian failure: Secondary | ICD-10-CM

## 2018-02-05 DIAGNOSIS — I10 Essential (primary) hypertension: Secondary | ICD-10-CM

## 2018-02-05 DIAGNOSIS — Z720 Tobacco use: Secondary | ICD-10-CM | POA: Insufficient documentation

## 2018-02-05 LAB — COMPREHENSIVE METABOLIC PANEL
ALK PHOS: 83 U/L (ref 40–150)
ALT: 24 U/L (ref 0–55)
AST: 18 U/L (ref 5–34)
Albumin: 4 g/dL (ref 3.5–5.0)
Anion gap: 10 (ref 3–11)
BILIRUBIN TOTAL: 0.4 mg/dL (ref 0.2–1.2)
BUN: 17 mg/dL (ref 7–26)
CALCIUM: 9.3 mg/dL (ref 8.4–10.4)
CO2: 24 mmol/L (ref 22–29)
Chloride: 106 mmol/L (ref 98–109)
Creatinine, Ser: 0.78 mg/dL (ref 0.60–1.10)
GFR calc Af Amer: >60 mL/min (ref >60)
GFR calc non Af Amer: >60 mL/min (ref >60)
GLUCOSE: 123 mg/dL (ref 70–140)
POTASSIUM: 4.7 mmol/L (ref 3.5–5.1)
Sodium: 140 mmol/L (ref 136–145)
TOTAL PROTEIN: 7.4 g/dL (ref 6.4–8.3)

## 2018-02-05 LAB — CBC WITH DIFFERENTIAL/PLATELET
BASOS ABS: 0 10*3/uL (ref 0.0–0.1)
Basophils Relative: 1 %
EOS PCT: 3 %
Eosinophils Absolute: 0.1 10*3/uL (ref 0.0–0.5)
HCT: 40.8 % (ref 34.8–46.6)
Hemoglobin: 13.3 g/dL (ref 11.6–15.9)
LYMPHS PCT: 30 %
Lymphs Abs: 1.3 10*3/uL (ref 0.9–3.3)
MCH: 30 pg (ref 25.1–34.0)
MCHC: 32.6 g/dL (ref 31.5–36.0)
MCV: 91.9 fL (ref 79.5–101.0)
MONO ABS: 0.4 10*3/uL (ref 0.1–0.9)
MONOS PCT: 10 %
Neutro Abs: 2.5 10*3/uL (ref 1.5–6.5)
Neutrophils Relative %: 56 %
PLATELETS: 245 10*3/uL (ref 145–400)
RBC: 4.44 MIL/uL (ref 3.70–5.45)
RDW: 13.2 % (ref 11.2–14.5)
WBC: 4.3 10*3/uL (ref 3.9–10.3)

## 2018-02-05 MED ORDER — LETROZOLE 2.5 MG PO TABS: 2.5000 mg | ORAL_TABLET | Freq: Every day | ORAL | 5 refills | Status: DC

## 2018-02-05 NOTE — Telephone Encounter (Signed)
Printed avs and calender of upcoming appointment. Per 5/29 los 

## 2018-03-24 ENCOUNTER — Ambulatory Visit
Admission: RE | Admit: 2018-03-24 | Discharge: 2018-03-24 | Disposition: A | Discharge status: Home / Self Care | Payer: Medicare Other | Source: Ambulatory Visit | Attending: Hematology | Admitting: Hematology

## 2018-03-24 ENCOUNTER — Ambulatory Visit: Admission: RE | Admit: 2018-03-24 | Payer: Medicare Other | Source: Ambulatory Visit

## 2018-03-24 DIAGNOSIS — C50211 Malignant neoplasm of upper-inner quadrant of right female breast: Secondary | ICD-10-CM

## 2018-03-24 DIAGNOSIS — Z170 Estrogen receptor positive status [ER+]: Principal | ICD-10-CM

## 2018-03-24 DIAGNOSIS — E2839 Other primary ovarian failure: Secondary | ICD-10-CM

## 2018-03-24 HISTORY — DX: Personal history of irradiation: Z92.3

## 2018-04-23 ENCOUNTER — Telehealth: Payer: Self-pay | Admitting: Hematology

## 2018-04-23 NOTE — Telephone Encounter (Signed)
Scheduled appt per 8/13 sch message- pt aware of appt date and time   

## 2018-04-28 ENCOUNTER — Telehealth: Payer: Self-pay | Admitting: *Deleted

## 2018-04-28 NOTE — Telephone Encounter (Signed)
Spoke with husband and informed him of pt's DEXA normal as per Dr. Burr Medico.  Husband would like to have a copy of the results when pt comes in on 8/29 for office visit.

## 2018-04-28 NOTE — Telephone Encounter (Signed)
-----   Message from Truitt Merle, MD sent at 03/27/2018  8:46 AM EDT ----- Please call pt's husband and let them know her DEXA was normal. Thanks   Truitt Merle  03/27/2018

## 2018-05-06 NOTE — Progress Notes (Signed)
[facility]  [phone] [phone]  Clinic Follow-Up Note   Patient Care Team: Jilda Panda, MD as PCP - General (Internal Medicine) Alphonsa Overall, MD as Consulting Physician (General Surgery) Truitt Merle, MD as Consulting Physician (Hematology) Eppie Gibson, MD as Attending Physician (Radiation Oncology) Gardenia Phlegm, NP as Nurse Practitioner (Hematology and Oncology)   Date of Service:  05/08/2018  CHIEF COMPLAINTS:  Follow up right breast cancer  Oncology History   Cancer Staging Malignant neoplasm of upper-inner quadrant of right breast in female, estrogen receptor positive ([place]) Staging form: Breast, AJCC 8th Edition - Clinical stage from 11/27/2016: Stage IA (cT1c, cN0, cM0, G2, ER: Positive, PR: Positive, HER2: Negative) - Signed by Truitt Merle, MD on 12/05/2016       Malignant neoplasm of upper-inner quadrant of right breast in female, estrogen receptor positive ([place])   11/16/2016 Mammogram    Diagnostic mammogram and US showed suspicious asymmetry/distortion within the inner right breast, 3 o'clock axis region, at middle depth, without definite sonographic Correlated, axilla (-)    11/27/2016 Receptors her2    ER 90%+ PR 40%+ HER2 - Ki67 5%    11/27/2016 Initial Biopsy    Breast, right, needle core biopsy, 3:00 o'clock - INVASIVE LOBULAR CARCINOMA, G1-2    11/27/2016 Initial Diagnosis    Malignant neoplasm of upper-inner quadrant of right breast in female, estrogen receptor positive ([place])    12/10/2016 Imaging    MR Breast Bilateral IMPRESSION: 1. Post biopsy changes and associated non masslike enhancement in the medial aspect of the right breast. 2. Left breast is negative. 3. No MRI evidence for adenopathy.    12/26/2016 Procedure    Colonoscopy  - Three 3 to 6 mm polyps in the descending colon, removed with a cold snare. Resected and retrieved. - The examination was otherwise normal on direct and retroflexion  views.    12/26/2016 Pathology Results    Diagnosis Surgical [P], descending, polyps (3) -TUBULAR ADENOMAS. -NO HIGH GRADE DYSPLASIA OR MALIGNANCY IDENTIFIED.    01/02/2017 Surgery    Right Breast lumpectomy with sentinel lymph node biopsy performed by Dr. Lucia Gaskins.    01/02/2017 Pathology Results    Diagnosis  1. Breast, lumpectomy, Right - INVASIVE LOBULAR CARCINOMA, 2.8 CM. - INVASIVE CARCINOMA BROADLY INVOLVES INFERIOR MARGIN. - PREVIOUS BIOPSY SITE. - FOCAL LYMPH VASCULAR INVOLVEMENT BY TUMOR. 2. Lymph node, sentinel, biopsy, Right Axillary - METASTATIC CARCINOMA IN ONE LYMPH NODE WITH EXTRANODAL EXTENSION (1/1).    01/18/2017 Surgery    RE-EXCISION OF BREAST LUMPECTOMY by Dr. Lucia Gaskins     01/18/2017 Pathology Results    Diagnosis 01/18/17 Breast, excision, Right Inferior Margin - ATYPICAL DUCTAL HYPERPLASIA. - FIBROCYSTIC CHANGES WITH CALCIFICATIONS. - VASCULAR CALCIFICATIONS. - SEE COMMENT. Microscopic Comment The surgical resection margin(s) of the specimen were inked and microscopically evaluated      - 03/20/2017 Radiation Therapy    Radiation by Dr. Isidore Moos     04/2017 -  Anti-estrogen oral therapy    Letrozole daily    03/24/2018 Imaging    03/24/2018 Bone Density ASSESSMENT: The BMD measured at Femur Neck Left is 0.944 g/cm2 with a T-score of -0.7. This patient is considered normal according to [facility]) criteria.    03/24/2018 Mammogram    03/24/2018 Mammogram IMPRESSION: Expected surgical changes in the medial central right breast. No mammographic evidence of malignancy in the bilateral breasts.      HISTORY OF PRESENTING ILLNESS (12/05/2016):  Crystal Logan 67 y.o.  female who presented for routine screening mammogram on 10/12/2016 and was found to have possible right breast asymmetry. Accordingly the patient underwent diagnostic mammogram and right breast ultrasound on 11/16/2016. These scans showed persistent asymmetry within the  inner right breast, 3 o'clock axis region, at middle depth, with associated architectural distortion. Biopsy of the right breast on 11/27/2016 revealed invasive mammary carcinoma, grade 1-2 / ER 90% / PR 40% / HER-2 negative / Ki67 5%.   The patient's case was presented to our multidisciplinary tumor board and she is seen in breast clinic today. She is here for further evaluation and treatment recommendations.   [name] is doing well overall. Our visit was facilitated by the patient's husband who translated throughout the visit. She has never had surgery before and is nervous about the procedure. She did not self palpate the breast mass. This was her first screening mammogram. She injured her breast three years ago and had intermittent pain in the breast since. This pain was worse with change in the weather. She reports a palpable lump post-biopsy. She denies pain at the biopsy site. She denies joint pain. She has no other major health issues. She has no family history of cancer. She smokes about 6-7 cigarettes daily. She has been smoking since 67 years-old. She did not experience hot flashes with menopause. She has not had a bone density scan.  GYN HISTORY  Menarchal: 80 or 67 years-old LMP: Around 67 years-old Contraceptive: None HRT: None GP: 1  CURRENT THERAPY: Letrozole once daily for 7 years starting 04/10/17, will change to anastrozole in 05/2018  INTERVAL HISTORY:   Crystal Logan returns to the clinic today fo follow up.  Her husband called to request of follow-up due to several concerns. she is here with her husband. Her husband translates for her. He states that she is becoming nervous and is gaining weight due to very good appetite. He also reports that she feels dizzy and experiences numbness in her hands, worse in the first 3 fingers of her left hand. He also states that her sleep is disturbed due to anxiety. She denies hot flashes and feeling depressed.     MEDICAL HISTORY:    Past Medical History:  Diagnosis Date  . Breast cancer ([place]) 12/2016   right  . History of radiation therapy 02/13/17- 03/20/17   Right Breast treated to 50 Gy in 25 fractions  . Hypertension    states under control with med., has been on med. ~ 15 yr.  . Personal history of radiation therapy 01/08/2017   Ended 02-01-2017    SURGICAL HISTORY: Past Surgical History:  Procedure Laterality Date  . BREAST LUMPECTOMY Right 01/02/2017  . BREAST LUMPECTOMY WITH RADIOACTIVE SEED AND SENTINEL LYMPH NODE BIOPSY Right 01/02/2017   Procedure: RIGHT BREAST LUMPECTOMY WITH RADIOACTIVE SEED AND RIGHT AXILLARY SENTINEL LYMPH NODE BIOPSY;  Surgeon: Alphonsa Overall, MD;  Location: [facility];  Service: General;  Laterality: Right;  . RE-EXCISION OF BREAST LUMPECTOMY Right 01/18/2017   Procedure: RE-EXCISION OF BREAST LUMPECTOMY;  Surgeon: Alphonsa Overall, MD;  Location: [facility];  Service: General;  Laterality: Right;  . TONSILLECTOMY      SOCIAL HISTORY: Social History   Socioeconomic History  . Marital status: Married    Spouse name: Not on file  . Number of children: Not on file  . Years of education: Not on file  . Highest education level: Not on file  Occupational History  . Not on file  Social Needs  . Financial resource strain: Not on file  . Food insecurity:    Worry: Not on file    Inability: Not on file  . Transportation needs:    Medical: Not on file    Non-medical: Not on file  Tobacco Use  . Smoking status: Current Every Day Smoker    Packs/day: 0.25    Years: 47.00    Pack years: 11.75    Types: Cigarettes  . Smokeless tobacco: Never Used  Substance and Sexual Activity  . Alcohol use: Yes    Comment: occasionally  . Drug use: No  . Sexual activity: Not on file  Lifestyle  . Physical activity:    Days per week: Not on file    Minutes per session: Not on file  . Stress: Not on file  Relationships  . Social connections:    Talks on phone: Not on file    Gets  together: Not on file    Attends religious service: Not on file    Active member of club or organization: Not on file    Attends meetings of clubs or organizations: Not on file    Relationship status: Not on file  . Intimate partner violence:    Fear of current or ex partner: Not on file    Emotionally abused: Not on file    Physically abused: Not on file    Forced sexual activity: Not on file  Other Topics Concern  . Not on file  Social History Narrative  . Not on file    FAMILY HISTORY: History reviewed. No pertinent family history.  ALLERGIES:  is allergic to no known allergies.  MEDICATIONS:  Current Outpatient Medications  Medication Sig Dispense Refill  . anastrozole (ARIMIDEX) 1 MG tablet Take 1 tablet (1 mg total) by mouth daily. 30 tablet 2  . HYDROcodone-acetaminophen (NORCO/VICODIN) 5-325 MG tablet Take 1-2 tablets by mouth every 6 (six) hours as needed for moderate pain. 20 tablet 0  . olmesartan-hydrochlorothiazide (BENICAR HCT) 20-12.5 MG tablet Take 1 tablet by mouth daily.    Marland Kitchen venlafaxine XR (EFFEXOR-XR) 37.5 MG 24 hr capsule Take 1 capsule (37.5 mg total) by mouth daily with breakfast. 30 capsule 3   No current facility-administered medications for this visit.     REVIEW OF SYSTEMS:   Constitutional: Denies fevers, chills or abnormal night sweats (+) increased appetite and weight gain Eyes: Denies blurriness of vision, double vision or watery eyes Ears, nose, mouth, throat, and face: Denies mucositis or sore throat  Respiratory: Denies cough, dyspnea or wheezes Cardiovascular: Denies palpitation, chest discomfort or lower extremity swelling Gastrointestinal:  Denies nausea, heartburn or change in bowel habits MSK: (+) Trigger finger in right thumb occasionally  Skin: Denies abnormal skin rashes   Lymphatics: Denies new lymphadenopathy or easy bruising Neurological:Denies new weaknesses (+) numbness in her hands, worse in first 3 fingers of left  hand Behavioral/Psych: Mood is stable, no new changes (+) anxiety and sleep disturbance  All other systems were reviewed with the patient and are negative.  PHYSICAL EXAMINATION:  ECOG PERFORMANCE STATUS: 0 - Asymptomatic  Vitals:   05/08/18 0831  BP: (!) 145/68  Pulse: 70  Resp: 18  Temp: 97.7 F (36.5 C)  SpO2: 100%   Filed Weights   05/08/18 0831  Weight: 217 lb 12.8 oz (98.8 kg)    GENERAL:alert, no distress and comfortable SKIN: skin color, texture, turgor are normal, no rashes or significant lesions EYES: normal, conjunctiva are pink and  non-injected, sclera clear OROPHARYNX:no exudate, no erythema and lips, buccal mucosa, and tongue normal  NECK: supple, thyroid normal size, non-tender, without nodularity LYMPH:  no palpable lymphadenopathy in the cervical, axillary or inguinal LUNGS: clear to auscultation and percussion with normal breathing effort HEART: regular rate & rhythm and no murmurs and no lower extremity edema ABDOMEN:abdomen soft, non-tender and normal bowel sounds Musculoskeletal:no cyanosis of digits and no clubbing  PSYCH: alert & oriented x 3 with fluent speech NEURO: no focal motor/sensory deficits BREAST: deferred today    LABORATORY DATA:  I have reviewed the data as listed CBC Latest Ref Rng & Units 05/08/2018 02/05/2018 09/04/2017  WBC 3.9 - 10.3 K/uL 6.5 4.3 7.5  Hemoglobin 11.6 - 15.9 g/dL 12.7 13.3 12.4  Hematocrit 34.8 - 46.6 % 37.9 40.8 36.9  Platelets 145 - 400 K/uL 254 245 312    CMP Latest Ref Rng & Units 05/08/2018 02/05/2018 09/04/2017  Glucose 70 - 99 mg/dL 122(H) 123 213(H)  BUN 8 - 23 mg/dL _0 Creatinine 0.44 - 1.00 mg/dL 0.81 0.78 0.65  Sodium 135 - 145 mmol/L 140 140 136  Potassium 3.5 - 5.1 mmol/L 4.5 4.7 3.8  Chloride 98 - 111 mmol/L 104 106 101  CO2 22 - 32 mmol/L _1 Calcium 8.9 - 10.3 mg/dL 9.7 9.3 9.5  Total Protein 6.5 - 8.1 g/dL 7.4 7.4 7.7  Total Bilirubin 0.3 - 1.2 mg/dL 0.4 0.4 0.4  Alkaline Phos 38  - 126 U/L 90 83 86  AST 15 - 41 U/L _2 ALT 0 - 44 U/L _3 PATHOLOGY  Diagnosis 01/18/17 Breast, excision, Right Inferior Margin - ATYPICAL DUCTAL HYPERPLASIA. - FIBROCYSTIC CHANGES WITH CALCIFICATIONS. - VASCULAR CALCIFICATIONS. - SEE COMMENT. Microscopic Comment The surgical resection margin(s) of the specimen were inked and microscopically evaluated  Diagnosis 01/02/17 1. Breast, lumpectomy, Right - INVASIVE LOBULAR CARCINOMA, 2.8 CM. - INVASIVE CARCINOMA BROADLY INVOLVES INFERIOR MARGIN. - PREVIOUS BIOPSY SITE. - FOCAL LYMPH VASCULAR INVOLVEMENT BY TUMOR. 2. Lymph node, sentinel, biopsy, Right Axillary - METASTATIC CARCINOMA IN ONE LYMPH NODE WITH EXTRANODAL EXTENSION (1/1). Microscopic Comment 1. BREAST, INVASIVE TUMOR Procedure: Localized lumpectomy and one sentinel lymph node. Laterality: Right breast. Tumor Size: 2.8 cm Histologic Type: Lobular Grade: I Tubular Differentiation: 3 Nuclear Pleomorphism: 1 Mitotic Count: 1 Ductal Carcinoma in Situ (DCIS): No Extent of Tumor: Skin: N/A Nipple: N/A Skeletal muscle: N/A Margins: Invasive carcinoma, distance from closest margin: Inferior margin broadly involved by invasive carcinoma. DCIS, distance from closest margin: N/A Regional Lymph Nodes: Number of Lymph Nodes Examined: 1 Number of Sentinel Lymph Nodes Examined: 1 Lymph Nodes with Macrometastases: 1 Lymph Nodes with Micrometastases: 0 Lymph Nodes with Isolated Tumor Cells: 0 Breast Prognostic Profile: Case # [id number] Estrogen Receptor: 90%, positive, strong staining Progesterone Receptor: 40%, positive, strong staining Her2: Negative, ratio 1.31 Ki-67: 5% Best tumor block for sendout testing: 1J Pathologic Stage Classification (pTNM, AJCC 8th Edition): Primary Tumor (pT): pT2 Regional Lymph Nodes (pN): pN1a Distant Metastases (pM): pMX (JDP:ecj 01/03/2017)  Diagnosis 12/26/16 Surgical [P], descending, polyps (3) -TUBULAR ADENOMAS. -NO  HIGH GRADE DYSPLASIA OR MALIGNANCY IDENTIFIED.  Diagnosis 11/27/2016 Breast, right, needle core biopsy, 3:00 o'clock - INVASIVE MAMMARY CARCINOMA. - SEE COMMENT. Microscopic Comment The carcinoma appears grade 1-2. An E-Cadherin stain and a breast prognostic profile will be performed and the results reported separately. The results were called to [facility] on 11/28/16. (JBK:gt, 11/28/16) The  malignant cells are negative for E-Cadherin, supporting a lobular phenotype. (JBK:kh 11/29/16) PROGNOSTIC INDICATORS Results: IMMUNOHISTOCHEMICAL AND MORPHOMETRIC ANALYSIS PERFORMED MANUALLY Estrogen Receptor: 90%, POSITIVE, STRONG STAINING INTENSITY Progesterone Receptor: 40%, POSITIVE, STRONG STAINING INTENSITY Proliferation Marker Ki67: 5% FLUORESCENCE IN-SITU HYBRIDIZATION Results: HER2 - NEGATIVE RATIO OF HER2/CEP17 SIGNALS 1.31 AVERAGE HER2 COPY NUMBER PER CELL 2.75   PROCEDURES Colonoscopy 12/26/16 - Three 3 to 6 mm polyps in the descending colon, removed with a cold snare. Resected and retrieved. - The examination was otherwise normal on direct and retroflexion views.  RADIOGRAPHIC STUDIES: I have personally reviewed the radiological images as listed and agreed with the findings in the report.  03/24/2018 Bone Density ASSESSMENT: The BMD measured at Femur Neck Left is 0.944 g/cm2 with a T-score of -0.7. This patient is considered normal according to [facility]) criteria.  03/24/2018 Mammogram IMPRESSION: Expected surgical changes in the medial central right breast. No mammographic evidence of malignancy in the bilateral breasts.   ASSESSMENT & PLAN:  67 y.o. post-menopausal female with   1. 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 -She  underwent a second surgery for positive margins, which showed no evidence of malignancy.  She had a few episodes of right breast abscess after surgery, last one in 2018 -Patient will not consider adjuvant chemotherapy, onoctype was not sent. -she has completed RT 03/20/17.  -She started adjuvant letrozole in 04/2017, tolerating well.  -She is overdue for a mammogram, I will schedule one for 02/2018.  -She is clinically doing well. Lab reviewed, her CBC and CMP are within normal limits. Her physical exam is unremarkable There is no clinical concern for recurrence. -She is not tolerating Letrozole due to increased appetite, weight gain, anxiety, and sleep disturbance. I will therefore stop Letrozole and start Anastrozole in 2 weeks. I advised her to monitor her symptoms while on Anastrozole. If she didn't improve, I will try Tamoxifen. -I will also give effexor for irritably and mood swing. She denies hot flashes.  -I advised her to see a dietician, but she declined. I advised her to eat healthy food and exercise. I also advised her to monitor her mood, as depression can contribut -I encouraged her to follow up with her PCP about her hand numbness as I think it is more likely carpal tunnel than neuropathy.    -F/u in 4 months. -I advised her to monitor her symptoms on Anastrozole and call me if she is not tolerating well.   2. Tobacco use - She has been smoking since 67 years-old. She previously smoked 1 ppd but has cut down. -We previously discussed smoking cessation -She has stopped smoking.   3. HTN -She'll continue medication and follow-up with her primary care physician  4. Bone Health  -We discussed his risk of osteopenia and osteoporosis from aromatase inhibitor -She has not had a bone density scan before, we'll obtain one this year as a baseline in 02/2018.    Plan:  -F/u in 4 months. -She is not tolerating Letrozole due to increased appetite, weight gain, anxiety, and sleep  disturbance. I will therefore stop Letrozole and start Anastrozole in 2 weeks. I advised her to monitor her symptoms while on Anastrozole. If she didn't improve, I will change to Tamoxifen. -I prescribed effexor 37.59m daily for irritably and mood swing  -  I advised her to monitor her symptoms on Anastrozole and call me if she is not tolerating.    No orders of the defined types were placed in this encounter.   All questions were answered. The patient knows to call the clinic with any problems, questions or concerns.  I spent 20 minutes counseling the patient face to face. The total time spent in the appointment was 25 minutes and more than 50% was on counseling.  Dierdre Searles Dweik am acting as scribe for Dr. Truitt Merle.  I have reviewed the above documentation for accuracy and completeness, and I agree with the above.      Truitt Merle, MD 05/08/2018

## 2018-05-08 ENCOUNTER — Telehealth: Payer: Self-pay | Admitting: Hematology

## 2018-05-08 ENCOUNTER — Inpatient Hospital Stay: Payer: Medicare Other | Attending: Hematology | Admitting: Hematology

## 2018-05-08 ENCOUNTER — Encounter: Payer: Self-pay | Admitting: Hematology

## 2018-05-08 ENCOUNTER — Inpatient Hospital Stay: Payer: Medicare Other

## 2018-05-08 VITALS — BP 145/68 | HR 70 | Temp 97.7°F | Resp 18 | Ht 64.0 in | Wt 217.8 lb

## 2018-05-08 DIAGNOSIS — F3489 Other specified persistent mood disorders: Secondary | ICD-10-CM | POA: Diagnosis not present

## 2018-05-08 DIAGNOSIS — R635 Abnormal weight gain: Secondary | ICD-10-CM | POA: Diagnosis not present

## 2018-05-08 DIAGNOSIS — I10 Essential (primary) hypertension: Secondary | ICD-10-CM

## 2018-05-08 DIAGNOSIS — Z87891 Personal history of nicotine dependence: Secondary | ICD-10-CM | POA: Diagnosis not present

## 2018-05-08 DIAGNOSIS — C50211 Malignant neoplasm of upper-inner quadrant of right female breast: Secondary | ICD-10-CM | POA: Diagnosis not present

## 2018-05-08 DIAGNOSIS — R454 Irritability and anger: Secondary | ICD-10-CM | POA: Diagnosis not present

## 2018-05-08 DIAGNOSIS — G478 Other sleep disorders: Secondary | ICD-10-CM | POA: Insufficient documentation

## 2018-05-08 DIAGNOSIS — F419 Anxiety disorder, unspecified: Secondary | ICD-10-CM

## 2018-05-08 DIAGNOSIS — Z170 Estrogen receptor positive status [ER+]: Secondary | ICD-10-CM | POA: Diagnosis not present

## 2018-05-08 DIAGNOSIS — R200 Anesthesia of skin: Secondary | ICD-10-CM | POA: Diagnosis not present

## 2018-05-08 DIAGNOSIS — C773 Secondary and unspecified malignant neoplasm of axilla and upper limb lymph nodes: Secondary | ICD-10-CM | POA: Insufficient documentation

## 2018-05-08 DIAGNOSIS — R42 Dizziness and giddiness: Secondary | ICD-10-CM | POA: Insufficient documentation

## 2018-05-08 LAB — CBC WITH DIFFERENTIAL/PLATELET
Basophils Absolute: 0.1 10*3/uL (ref 0.0–0.1)
Basophils Relative: 1 %
EOS PCT: 2 %
Eosinophils Absolute: 0.1 10*3/uL (ref 0.0–0.5)
HCT: 37.9 % (ref 34.8–46.6)
HEMOGLOBIN: 12.7 g/dL (ref 11.6–15.9)
LYMPHS ABS: 1.1 10*3/uL (ref 0.9–3.3)
LYMPHS PCT: 17 %
MCH: 30.4 pg (ref 25.1–34.0)
MCHC: 33.6 g/dL (ref 31.5–36.0)
MCV: 90.5 fL (ref 79.5–101.0)
Monocytes Absolute: 0.9 10*3/uL (ref 0.1–0.9)
Monocytes Relative: 13 %
NEUTROS PCT: 67 %
Neutro Abs: 4.3 10*3/uL (ref 1.5–6.5)
Platelets: 254 10*3/uL (ref 145–400)
RBC: 4.19 MIL/uL (ref 3.70–5.45)
RDW: 13.2 % (ref 11.2–14.5)
WBC: 6.5 10*3/uL (ref 3.9–10.3)

## 2018-05-08 LAB — COMPREHENSIVE METABOLIC PANEL
ALT: 18 U/L (ref 0–44)
AST: 15 U/L (ref 15–41)
Albumin: 3.9 g/dL (ref 3.5–5.0)
Alkaline Phosphatase: 90 U/L (ref 38–126)
Anion gap: 10 (ref 5–15)
BUN: 18 mg/dL (ref 8–23)
CHLORIDE: 104 mmol/L (ref 98–111)
CO2: 26 mmol/L (ref 22–32)
Calcium: 9.7 mg/dL (ref 8.9–10.3)
Creatinine, Ser: 0.81 mg/dL (ref 0.44–1.00)
GFR calc Af Amer: >60 mL/min (ref >60)
Glucose, Bld: 122 mg/dL — ABNORMAL HIGH (ref 70–99)
Potassium: 4.5 mmol/L (ref 3.5–5.1)
SODIUM: 140 mmol/L (ref 135–145)
Total Bilirubin: 0.4 mg/dL (ref 0.3–1.2)
Total Protein: 7.4 g/dL (ref 6.5–8.1)

## 2018-05-08 MED ORDER — VENLAFAXINE HCL ER 37.5 MG PO CP24: 37.5000 mg | ORAL_CAPSULE | Freq: Every day | ORAL | 3 refills | Status: DC

## 2018-05-08 MED ORDER — ANASTROZOLE 1 MG PO TABS: 1.0000 mg | ORAL_TABLET | Freq: Every day | ORAL | 2 refills | Status: DC

## 2018-05-08 NOTE — Telephone Encounter (Signed)
No LOS 8/29

## 2018-08-11 ENCOUNTER — Other Ambulatory Visit: Payer: Self-pay

## 2018-08-11 DIAGNOSIS — C50211 Malignant neoplasm of upper-inner quadrant of right female breast: Secondary | ICD-10-CM

## 2018-08-11 DIAGNOSIS — Z170 Estrogen receptor positive status [ER+]: Principal | ICD-10-CM

## 2018-08-11 MED ORDER — ANASTROZOLE 1 MG PO TABS: 1.0000 mg | ORAL_TABLET | Freq: Every day | ORAL | 2 refills | Status: DC

## 2018-10-06 NOTE — Progress Notes (Signed)
[place]   [phone] [phone]   Clinic Follow up Note   Patient Care Team: Jilda Panda, MD as PCP - General (Internal Medicine) Alphonsa Overall, MD as Consulting Physician (General Surgery) Truitt Merle, MD as Consulting Physician (Hematology) Eppie Gibson, MD as Attending Physician (Radiation Oncology) Gardenia Phlegm, NP as Nurse Practitioner (Hematology and Oncology)  Date of Service:  10/08/2018  CHIEF COMPLAINT: Follow up right breast cancer  SUMMARY OF ONCOLOGIC HISTORY: Oncology History   Cancer Staging Malignant neoplasm of upper-inner quadrant of right breast in female, estrogen receptor positive ([place]) Staging form: Breast, AJCC 8th Edition - Clinical stage from 11/27/2016: Stage IA (cT1c, cN0, cM0, G2, ER: Positive, PR: Positive, HER2: Negative) - Signed by Truitt Merle, MD on 12/05/2016       Malignant neoplasm of upper-inner quadrant of right breast in female, estrogen receptor positive ([place])   11/16/2016 Mammogram    Diagnostic mammogram and US showed suspicious asymmetry/distortion within the inner right breast, 3 o'clock axis region, at middle depth, without definite sonographic Correlated, axilla (-)    11/27/2016 Receptors her2    ER 90%+ PR 40%+ HER2 - Ki67 5%    11/27/2016 Initial Biopsy    Breast, right, needle core biopsy, 3:00 o'clock - INVASIVE LOBULAR CARCINOMA, G1-2    11/27/2016 Initial Diagnosis    Malignant neoplasm of upper-inner quadrant of right breast in female, estrogen receptor positive ([place])    12/10/2016 Imaging    MR Breast Bilateral IMPRESSION: 1. Post biopsy changes and associated non masslike enhancement in the medial aspect of the right breast. 2. Left breast is negative. 3. No MRI evidence for adenopathy.    12/26/2016 Procedure    Colonoscopy  - Three 3 to 6 mm polyps in the descending colon, removed with a cold snare. Resected and retrieved. - The examination was otherwise normal on  direct and retroflexion views.    12/26/2016 Pathology Results    Diagnosis Surgical [P], descending, polyps (3) -TUBULAR ADENOMAS. -NO HIGH GRADE DYSPLASIA OR MALIGNANCY IDENTIFIED.    01/02/2017 Surgery    Right Breast lumpectomy with sentinel lymph node biopsy performed by Dr. Lucia Gaskins.    01/02/2017 Pathology Results    Diagnosis  1. Breast, lumpectomy, Right - INVASIVE LOBULAR CARCINOMA, 2.8 CM. - INVASIVE CARCINOMA BROADLY INVOLVES INFERIOR MARGIN. - PREVIOUS BIOPSY SITE. - FOCAL LYMPH VASCULAR INVOLVEMENT BY TUMOR. 2. Lymph node, sentinel, biopsy, Right Axillary - METASTATIC CARCINOMA IN ONE LYMPH NODE WITH EXTRANODAL EXTENSION (1/1).    01/18/2017 Surgery    RE-EXCISION OF BREAST LUMPECTOMY by Dr. Lucia Gaskins     01/18/2017 Pathology Results    Diagnosis 01/18/17 Breast, excision, Right Inferior Margin - ATYPICAL DUCTAL HYPERPLASIA. - FIBROCYSTIC CHANGES WITH CALCIFICATIONS. - VASCULAR CALCIFICATIONS. - SEE COMMENT. Microscopic Comment The surgical resection margin(s) of the specimen were inked and microscopically evaluated      - 03/20/2017 Radiation Therapy    Radiation by Dr. Isidore Moos     04/2017 -  Anti-estrogen oral therapy    Letrozole daily started in 04/2017. Due to anxiety, weight gain and troule sleeping I swithced her to Anastrozole in 05/2018.     03/24/2018 Imaging    03/24/2018 Bone Density ASSESSMENT: The BMD measured at Femur Neck Left is 0.944 g/cm2 with a T-score of -0.7. This patient is considered normal according to [facility]) criteria.    03/24/2018 Mammogram    03/24/2018 Mammogram IMPRESSION: Expected surgical changes in the medial central right breast. No mammographic  evidence of malignancy in the bilateral breasts.    03/24/2018 Imaging    DEXA ASSESSMENT: The BMD measured at Femur Neck Left is 0.944 g/cm2 with a T-score of -0.7. This patient is considered normal according to [facility]) criteria.  The  scan quality is good. L-3, L-4 were excluded due to degenerative changes.  Site Region Measured Date Measured Age YA BMD Significant CHANGE T-score AP Spine  L1-L2     03[age]24/2018    [age]         -0.5    1.110 g/cm2  DualFemur Neck Left 3/24/2018    67.2         -0.7    0.944 g/cm2  World Health Organization [facility]) criteria for post-menopausal, Caucasian Women: Normal       T-score at or above -1 SD Osteopenia   T-score between -1 and -2.5 SD Osteoporosis T-score at or below -2.5 SD  RECOMMENDATION: 1. All patients should optimize calcium and vitamin D intake. 2. Consider FDA approved medical therapies in postmenopausal women and men aged 81 years and older, based on the following: a. A hip or vertebral (clinical or morphometric) fracture b. T- score < or = -2.5 at the femoral neck or spine after appropriate evaluation to exclude secondary causes c. Low bone mass (T-score between -1.0 and -2.5 at the femoral neck or spine) and a 10 year probability of a hip fracture > or = 3% or a 10 year probability of a major osteoporosis-related fracture > or = 20% based on the [place]-adapted WHO algorithm d. Clinician judgment and/or patient preferences may indicate treatment for people with 10-year fracture probabilities above or below these levels      CURRENT THERAPY:  Letrozole once daily for 7 years starting 04/10/17. Change to anastrozole in 05/2018  INTERVAL HISTORY:  Crystal Logan is here for a follow up of right breast cancer. She was last seen by me 5 months ago. She presents to the clinic today with her husband. She notes she is doing well. She is taking Anastrozole and according to her husband she is tolerating well. She has been gaining weight, 10 pounds since last visit and has some increase in facial hair. She is worried about her weight gain.     REVIEW OF SYSTEMS:   Constitutional: Denies fevers, chills or abnormal weight loss (+) weight gain  Eyes: Denies  blurriness of vision Ears, nose, mouth, throat, and face: Denies mucositis or sore throat Respiratory: Denies cough, dyspnea or wheezes Cardiovascular: Denies palpitation, chest discomfort or lower extremity swelling Gastrointestinal:  Denies nausea, heartburn or change in bowel habits Skin: Denies abnormal skin rashes Lymphatics: Denies new lymphadenopathy or easy bruising Neurological:Denies numbness, tingling or new weaknesses Behavioral/Psych: Mood is stable, no new changes  All other systems were reviewed with the patient and are negative.  MEDICAL HISTORY:  Past Medical History:  Diagnosis Date  . Breast cancer ([place]) 12/2016   right  . History of radiation therapy 02/13/17- 03/20/17   Right Breast treated to 50 Gy in 25 fractions  . Hypertension    states under control with med., has been on med. ~ 15 yr.  . Personal history of radiation therapy 01/08/2017   Ended 02-01-2017    SURGICAL HISTORY: Past Surgical History:  Procedure Laterality Date  . BREAST LUMPECTOMY Right 01/02/2017  . BREAST LUMPECTOMY WITH RADIOACTIVE SEED AND SENTINEL LYMPH NODE BIOPSY Right 01/02/2017   Procedure: RIGHT BREAST LUMPECTOMY WITH RADIOACTIVE SEED AND RIGHT  AXILLARY SENTINEL LYMPH NODE BIOPSY;  Surgeon: Alphonsa Overall, MD;  Location: [place];  Service: General;  Laterality: Right;  . RE-EXCISION OF BREAST LUMPECTOMY Right 01/18/2017   Procedure: RE-EXCISION OF BREAST LUMPECTOMY;  Surgeon: Alphonsa Overall, MD;  Location: [place];  Service: General;  Laterality: Right;  . TONSILLECTOMY      I have reviewed the social history and family history with the patient and they are unchanged from previous note.  ALLERGIES:  is allergic to no known allergies.  MEDICATIONS:  Current Outpatient Medications  Medication Sig Dispense Refill  . anastrozole (ARIMIDEX) 1 MG tablet Take 1 tablet (1 mg total) by mouth daily. 30 tablet 2  . olmesartan-hydrochlorothiazide (BENICAR HCT) 20-12.5 MG tablet  Take 1 tablet by mouth daily.     No current facility-administered medications for this visit.     PHYSICAL EXAMINATION: ECOG PERFORMANCE STATUS: 0 - Asymptomatic  Vitals:   10/08/18 1140  BP: (!) 157/90  Pulse: 75  Resp: 18  Temp: 98 F (36.7 C)  SpO2: 97%   Filed Weights   10/08/18 1140  Weight: 228 lb 8 oz (103.6 kg)    GENERAL:alert, no distress and comfortable SKIN: skin color, texture, turgor are normal, no rashes or significant lesions EYES: normal, Conjunctiva are pink and non-injected, sclera clear OROPHARYNX:no exudate, no erythema and lips, buccal mucosa, and tongue normal  NECK: supple, thyroid normal size, non-tender, without nodularity LYMPH:  no palpable lymphadenopathy in the cervical, axillary or inguinal LUNGS: clear to auscultation and percussion with normal breathing effort HEART: regular rate & rhythm and no murmurs and no lower extremity edema ABDOMEN:abdomen soft, non-tender and normal bowel sounds Musculoskeletal:no cyanosis of digits and no clubbing  NEURO: alert & oriented x 3 with fluent speech, no focal motor/sensory deficits BREAST: S/p right breast lumpectomy: surgical incision healed well with mild scar tissue (+) No palpable mass or adenopathy in either breasts   LABORATORY DATA:  I have reviewed the data as listed CBC Latest Ref Rng & Units 10/08/2018 05/08/2018 02/05/2018  WBC 4.0 - 10.5 K/uL 4.9 6.5 4.3  Hemoglobin 12.0 - 15.0 g/dL 12.9 12.7 13.3  Hematocrit 36.0 - 46.0 % 39.0 37.9 40.8  Platelets 150 - 400 K/uL 238 254 245     CMP Latest Ref Rng & Units 10/08/2018 05/08/2018 02/05/2018  Glucose 70 - 99 mg/dL 144(H) 122(H) 123  BUN 8 - 23 mg/dL 13 18 17   Creatinine 0.44 - 1.00 mg/dL 0.84 0.81 0.78  Sodium 135 - 145 mmol/L 137 140 140  Potassium 3.5 - 5.1 mmol/L 4.4 4.5 4.7  Chloride 98 - 111 mmol/L 105 104 106  CO2 22 - 32 mmol/L 25 26 24   Calcium 8.9 - 10.3 mg/dL 9.4 9.7 9.3  Total Protein 6.5 - 8.1 g/dL 7.4 7.4 7.4  Total Bilirubin  0.3 - 1.2 mg/dL 0.5 0.4 0.4  Alkaline Phos 38 - 126 U/L 89 90 83  AST 15 - 41 U/L 18 15 18   ALT 0 - 44 U/L 22 18 24       RADIOGRAPHIC STUDIES: I have personally reviewed the radiological images as listed and agreed with the findings in the report. No results found.   ASSESSMENT & PLAN:  Crystal Logan is a 68 y.o. female with   1. 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  -She started antiestrogen therapy with letrozole in 04/2017. Due to weight gain, anxiety and trouble sleeping, I switched her to Anastrozole in 05/2018.  -She tolerates Anastrozole better but still continues to gain weight and has manageable hot flashes. I encouraged her to continue anti-estrogen therapy to complete at least 5 years, prefer 7 years due to her lobular histology. She is willing to try.  -I encourage her to have a healthy life style with low carb diet, more exercise to better manage her weight. I offered chance to participate with [facility], they declined.  -She is clinically doing well. Lab reviewed, her CBC and CMP are within normal limits, except Glucose at 144. Her physical exam and her 03/2018 mammogram were unremarkable. There is no clinical concern for recurrence. -Continue breast cancer surveillance -F/u in 6 months   2. Tobacco use - She has been smoking since 68 years-old. She previously smoked 1 ppd but has cut down. -She has stopped smoking.   3. HTN -She'll continue medication and follow-up with her primary care physician -On Benicar, will continue  -BP at 157/90 today (10/08/17). She forgot to take Benicar this morning, I encouraged her to take when she get home.   4. Bone Health  -We discussed his risk of osteopenia and osteoporosis from aromatase  inhibitor -Baseline DEXA from 03/2018 was normal   5. Obesity  -We discussed healthy diet, especially low-carb diet, and increase exercise, to help her lose weight -Willing to try.  She declined referral to weight management clinic  Plan:  Lab and f/u in 6 months  Continue Anastrozole  Mammogram in 03/2019    No problem-specific Assessment & Plan notes found for this encounter.   Orders Placed This Encounter  Procedures  . MM DIAG BREAST TOMO BILATERAL    Standing Status:   Future    Standing Expiration Date:   10/09/2019    Order Specific Question:   Reason for Exam (SYMPTOM  OR DIAGNOSIS REQUIRED)    Answer:   screening    Order Specific Question:   Preferred imaging location?    Answer:   [facility]   All questions were answered. The patient knows to call the clinic with any problems, questions or concerns. No barriers to learning was detected. I spent 20 minutes counseling the patient face to face. The total time spent in the appointment was 25 minutes and more than 50% was on counseling and review of test results     Truitt Merle, MD 10/08/2018   I, Joslyn Devon, am acting as scribe for Truitt Merle, MD.   I have reviewed the above documentation for accuracy and completeness, and I agree with the above.

## 2018-10-08 ENCOUNTER — Inpatient Hospital Stay: Payer: Medicare Other

## 2018-10-08 ENCOUNTER — Telehealth: Payer: Self-pay | Admitting: Hematology

## 2018-10-08 ENCOUNTER — Encounter: Payer: Self-pay | Admitting: Hematology

## 2018-10-08 ENCOUNTER — Inpatient Hospital Stay: Payer: Medicare Other | Attending: Hematology | Admitting: Hematology

## 2018-10-08 VITALS — BP 157/90 | HR 75 | Temp 98.0°F | Resp 18 | Ht 64.0 in | Wt 228.5 lb

## 2018-10-08 DIAGNOSIS — E669 Obesity, unspecified: Secondary | ICD-10-CM | POA: Diagnosis not present

## 2018-10-08 DIAGNOSIS — C50211 Malignant neoplasm of upper-inner quadrant of right female breast: Secondary | ICD-10-CM | POA: Diagnosis not present

## 2018-10-08 DIAGNOSIS — I10 Essential (primary) hypertension: Secondary | ICD-10-CM | POA: Diagnosis not present

## 2018-10-08 DIAGNOSIS — Z170 Estrogen receptor positive status [ER+]: Secondary | ICD-10-CM | POA: Diagnosis not present

## 2018-10-08 DIAGNOSIS — C773 Secondary and unspecified malignant neoplasm of axilla and upper limb lymph nodes: Secondary | ICD-10-CM | POA: Insufficient documentation

## 2018-10-08 DIAGNOSIS — R635 Abnormal weight gain: Secondary | ICD-10-CM | POA: Insufficient documentation

## 2018-10-08 DIAGNOSIS — N951 Menopausal and female climacteric states: Secondary | ICD-10-CM | POA: Insufficient documentation

## 2018-10-08 LAB — CBC WITH DIFFERENTIAL/PLATELET
Abs Immature Granulocytes: 0.01 10*3/uL (ref 0.00–0.07)
BASOS ABS: 0.1 10*3/uL (ref 0.0–0.1)
Basophils Relative: 1 %
Eosinophils Absolute: 0.1 10*3/uL (ref 0.0–0.5)
Eosinophils Relative: 2 %
HCT: 39 % (ref 36.0–46.0)
Hemoglobin: 12.9 g/dL (ref 12.0–15.0)
IMMATURE GRANULOCYTES: 0 %
Lymphocytes Relative: 26 %
Lymphs Abs: 1.3 10*3/uL (ref 0.7–4.0)
MCH: 30 pg (ref 26.0–34.0)
MCHC: 33.1 g/dL (ref 30.0–36.0)
MCV: 90.7 fL (ref 80.0–100.0)
Monocytes Absolute: 0.6 10*3/uL (ref 0.1–1.0)
Monocytes Relative: 13 %
NEUTROS ABS: 2.8 10*3/uL (ref 1.7–7.7)
Neutrophils Relative %: 58 %
Platelets: 238 10*3/uL (ref 150–400)
RBC: 4.3 MIL/uL (ref 3.87–5.11)
RDW: 12.9 % (ref 11.5–15.5)
WBC: 4.9 10*3/uL (ref 4.0–10.5)
nRBC: 0 % (ref 0.0–0.2)

## 2018-10-08 LAB — COMPREHENSIVE METABOLIC PANEL
ALT: 22 U/L (ref 0–44)
AST: 18 U/L (ref 15–41)
Albumin: 4 g/dL (ref 3.5–5.0)
Alkaline Phosphatase: 89 U/L (ref 38–126)
Anion gap: 7 (ref 5–15)
BUN: 13 mg/dL (ref 8–23)
CO2: 25 mmol/L (ref 22–32)
Calcium: 9.4 mg/dL (ref 8.9–10.3)
Chloride: 105 mmol/L (ref 98–111)
Creatinine, Ser: 0.84 mg/dL (ref 0.44–1.00)
GFR calc Af Amer: >60 mL/min (ref >60)
GFR calc non Af Amer: >60 mL/min (ref >60)
Glucose, Bld: 144 mg/dL — ABNORMAL HIGH (ref 70–99)
POTASSIUM: 4.4 mmol/L (ref 3.5–5.1)
Sodium: 137 mmol/L (ref 135–145)
Total Bilirubin: 0.5 mg/dL (ref 0.3–1.2)
Total Protein: 7.4 g/dL (ref 6.5–8.1)

## 2018-10-08 NOTE — Telephone Encounter (Signed)
Scheduled appt per 01/29 los. ° °Printed calendar and avs. °

## 2018-11-10 ENCOUNTER — Other Ambulatory Visit: Payer: Self-pay | Admitting: *Deleted

## 2018-11-10 DIAGNOSIS — C50211 Malignant neoplasm of upper-inner quadrant of right female breast: Secondary | ICD-10-CM

## 2018-11-10 DIAGNOSIS — Z170 Estrogen receptor positive status [ER+]: Principal | ICD-10-CM

## 2018-11-10 MED ORDER — ANASTROZOLE 1 MG PO TABS: 1.0000 mg | ORAL_TABLET | Freq: Every day | ORAL | 2 refills | Status: DC

## 2019-02-05 ENCOUNTER — Other Ambulatory Visit: Payer: Self-pay | Admitting: Hematology

## 2019-02-05 DIAGNOSIS — C50211 Malignant neoplasm of upper-inner quadrant of right female breast: Secondary | ICD-10-CM

## 2019-02-05 DIAGNOSIS — Z170 Estrogen receptor positive status [ER+]: Secondary | ICD-10-CM

## 2019-03-26 ENCOUNTER — Ambulatory Visit
Admission: RE | Admit: 2019-03-26 | Discharge: 2019-03-26 | Disposition: A | Discharge status: Home / Self Care | Payer: Medicare Other | Source: Ambulatory Visit | Attending: Hematology | Admitting: Hematology

## 2019-03-26 ENCOUNTER — Other Ambulatory Visit: Payer: Self-pay

## 2019-03-26 DIAGNOSIS — C50211 Malignant neoplasm of upper-inner quadrant of right female breast: Secondary | ICD-10-CM

## 2019-04-06 NOTE — Progress Notes (Signed)
[place]   [phone] [phone]   Clinic Follow up Note   Patient Care Team: Jilda Panda, MD as PCP - General (Internal Medicine) Alphonsa Overall, MD as Consulting Physician (General Surgery) Truitt Merle, MD as Consulting Physician (Hematology) Eppie Gibson, MD as Attending Physician (Radiation Oncology) Gardenia Phlegm, NP as Nurse Practitioner (Hematology and Oncology)  Date of Service:  04/08/2019  CHIEF COMPLAINT: Follow up right breast cancer  SUMMARY OF ONCOLOGIC HISTORY: Oncology History Overview Note  Cancer Staging Malignant neoplasm of upper-inner quadrant of right breast in female, estrogen receptor positive ([place]) Staging form: Breast, AJCC 8th Edition - Clinical stage from 11/27/2016: Stage IA (cT1c, cN0, cM0, G2, ER: Positive, PR: Positive, HER2: Negative) - Signed by Truitt Merle, MD on 12/05/2016     Malignant neoplasm of upper-inner quadrant of right breast in female, estrogen receptor positive ([place])  11/16/2016 Mammogram   Diagnostic mammogram and US showed suspicious asymmetry/distortion within the inner right breast, 3 o'clock axis region, at middle depth, without definite sonographic Correlated, axilla (-)   11/27/2016 Receptors her2   ER 90%+ PR 40%+ HER2 - Ki67 5%   11/27/2016 Initial Biopsy   Breast, right, needle core biopsy, 3:00 o'clock - INVASIVE LOBULAR CARCINOMA, G1-2   11/27/2016 Initial Diagnosis   Malignant neoplasm of upper-inner quadrant of right breast in female, estrogen receptor positive ([place])   12/10/2016 Imaging   MR Breast Bilateral IMPRESSION: 1. Post biopsy changes and associated non masslike enhancement in the medial aspect of the right breast. 2. Left breast is negative. 3. No MRI evidence for adenopathy.   12/26/2016 Procedure   Colonoscopy  - Three 3 to 6 mm polyps in the descending colon, removed with a cold snare. Resected and retrieved. - The examination was otherwise normal on  direct and retroflexion views.   12/26/2016 Pathology Results   Diagnosis Surgical [P], descending, polyps (3) -TUBULAR ADENOMAS. -NO HIGH GRADE DYSPLASIA OR MALIGNANCY IDENTIFIED.   01/02/2017 Surgery   Right Breast lumpectomy with sentinel lymph node biopsy performed by Dr. Lucia Gaskins.   01/02/2017 Pathology Results   Diagnosis  1. Breast, lumpectomy, Right - INVASIVE LOBULAR CARCINOMA, 2.8 CM. - INVASIVE CARCINOMA BROADLY INVOLVES INFERIOR MARGIN. - PREVIOUS BIOPSY SITE. - FOCAL LYMPH VASCULAR INVOLVEMENT BY TUMOR. 2. Lymph node, sentinel, biopsy, Right Axillary - METASTATIC CARCINOMA IN ONE LYMPH NODE WITH EXTRANODAL EXTENSION (1/1).   01/18/2017 Surgery   RE-EXCISION OF BREAST LUMPECTOMY by Dr. Lucia Gaskins    01/18/2017 Pathology Results   Diagnosis 01/18/17 Breast, excision, Right Inferior Margin - ATYPICAL DUCTAL HYPERPLASIA. - FIBROCYSTIC CHANGES WITH CALCIFICATIONS. - VASCULAR CALCIFICATIONS. - SEE COMMENT. Microscopic Comment The surgical resection margin(s) of the specimen were inked and microscopically evaluated     - 03/20/2017 Radiation Therapy   Radiation by Dr. Isidore Moos    04/2017 -  Anti-estrogen oral therapy   Letrozole daily started in 04/2017. Due to anxiety, weight gain and troule sleeping I swithced her to Anastrozole in 05/2018.    03/24/2018 Imaging   03/24/2018 Bone Density ASSESSMENT: The BMD measured at Femur Neck Left is 0.944 g/cm2 with a T-score of -0.7. This patient is considered normal according to [facility]) criteria.   03/24/2018 Mammogram   03/24/2018 Mammogram IMPRESSION: Expected surgical changes in the medial central right breast. No mammographic evidence of malignancy in the bilateral breasts.   03/24/2018 Imaging   DEXA ASSESSMENT: The BMD measured at Femur Neck Left is 0.944 g/cm2 with a T-score of -0.7. This  patient is considered normal according to [facility]) criteria.  The scan quality is good.  L-3, L-4 were excluded due to degenerative changes.  Site Region Measured Date Measured Age YA BMD Significant CHANGE T-score AP Spine  L1-L2     3/24/[age]         -0.5    1.110 g/cm2  DualFemur Neck Left 3/24/2018    [age]         -0.7    0.944 g/cm2  World Health Organization [facility]) criteria for post-menopausal, Caucasian Women: Normal       T-score at or above -1 SD Osteopenia   T-score between -1 and -2.5 SD Osteoporosis T-score at or below -2.5 SD  RECOMMENDATION: 1. All patients should optimize calcium and vitamin D intake. 2. Consider FDA approved medical therapies in postmenopausal women and men aged 43 years and older, based on the following: a. A hip or vertebral (clinical or morphometric) fracture b. T- score < or = -2.5 at the femoral neck or spine after appropriate evaluation to exclude secondary causes c. Low bone mass (T-score between -1.0 and -2.5 at the femoral neck or spine) and a 10 year probability of a hip fracture > or = 3% or a 10 year probability of a major osteoporosis-related fracture > or = 20% based on the [place]-adapted WHO algorithm d. Clinician judgment and/or patient preferences may indicate treatment for people with 10-year fracture probabilities above or below these levels      CURRENT THERAPY:  Letrozole once daily for 7 years starting 04/10/17. Change to anastrozole in 05/2018   INTERVAL HISTORY:  Crystal Logan is here for a follow up right breast cancer. She was last seen by me 6 months ago. She presents to the clinic with her husband to help her translate. She notes her appetite is elevated and has gained 5-6 pounds since last time. She has fatigue and pain in her back, right knee and fingers. She denies nay other issues and she no longer feels fluid on her chest.     REVIEW OF SYSTEMS:   Constitutional: Denies fevers, chills (+) weight gain (+) fatigue  Eyes: Denies blurriness of vision Ears, nose, mouth, throat, and  face: Denies mucositis or sore throat Respiratory: Denies cough, dyspnea or wheezes Cardiovascular: Denies palpitation, chest discomfort or lower extremity swelling Gastrointestinal:  Denies nausea, heartburn or change in bowel habits Skin: Denies abnormal skin rashes MSK: (+) joint pain in back, right knee and fingers  Lymphatics: Denies new lymphadenopathy or easy bruising Neurological:Denies numbness, tingling or new weaknesses Behavioral/Psych: Mood is stable, no new changes  All other systems were reviewed with the patient and are negative.  MEDICAL HISTORY:  Past Medical History:  Diagnosis Date  . Breast cancer ([place]) 12/2016   right  . History of radiation therapy 02/13/17- 03/20/17   Right Breast treated to 50 Gy in 25 fractions  . Hypertension    states under control with med., has been on med. ~ 15 yr.  . Personal history of radiation therapy 01/08/2017   Ended 02-01-2017    SURGICAL HISTORY: Past Surgical History:  Procedure Laterality Date  . BREAST LUMPECTOMY Right 01/02/2017  . BREAST LUMPECTOMY WITH RADIOACTIVE SEED AND SENTINEL LYMPH NODE BIOPSY Right 01/02/2017   Procedure: RIGHT BREAST LUMPECTOMY WITH RADIOACTIVE SEED AND RIGHT AXILLARY SENTINEL LYMPH NODE BIOPSY;  Surgeon: Alphonsa Overall, MD;  Location: [place];  Service: General;  Laterality: Right;  .  RE-EXCISION OF BREAST LUMPECTOMY Right 01/18/2017   Procedure: RE-EXCISION OF BREAST LUMPECTOMY;  Surgeon: Alphonsa Overall, MD;  Location: [place];  Service: General;  Laterality: Right;  . TONSILLECTOMY      I have reviewed the social history and family history with the patient and they are unchanged from previous note.  ALLERGIES:  is allergic to no known allergies.  MEDICATIONS:  Current Outpatient Medications  Medication Sig Dispense Refill  . anastrozole (ARIMIDEX) 1 MG tablet Take 1 tablet (1 mg total) by mouth daily. 30 tablet 5  . olmesartan-hydrochlorothiazide (BENICAR HCT) 20-12.5 MG tablet  Take 1 tablet by mouth daily.     No current facility-administered medications for this visit.     PHYSICAL EXAMINATION: ECOG PERFORMANCE STATUS: 1 - Symptomatic but completely ambulatory  Vitals:   04/08/19 1017  BP: (!) 146/76  Pulse: 73  Resp: 18  Temp: (!) 97.4 F (36.3 C)  SpO2: 97%   Filed Weights   04/08/19 1017  Weight: 235 lb 3.2 oz (106.7 kg)    GENERAL:alert, no distress and comfortable SKIN: skin color, texture, turgor are normal, no rashes or significant lesions EYES: normal, Conjunctiva are pink and non-injected, sclera clear  NECK: supple, thyroid normal size, non-tender, without nodularity LYMPH:  no palpable lymphadenopathy in the cervical, axillary  LUNGS: clear to auscultation and percussion with normal breathing effort HEART: regular rate & rhythm and no murmurs and no lower extremity edema ABDOMEN:abdomen soft, non-tender and normal bowel sounds Musculoskeletal:no cyanosis of digits and no clubbing  NEURO: alert & oriented x 3 with fluent speech, no focal motor/sensory deficits BREAST: (+) S/p right lumpectomy: Surgical incision healed well (+) Skin erythema of right breast, mildly tender. No palpable mass, nodules or adenopathy bilaterally. Breast exam benign.   LABORATORY DATA:  I have reviewed the data as listed CBC Latest Ref Rng & Units 04/08/2019 10/08/2018 05/08/2018  WBC 4.0 - 10.5 K/uL 5.3 4.9 6.5  Hemoglobin 12.0 - 15.0 g/dL 13.1 12.9 12.7  Hematocrit 36.0 - 46.0 % 40.2 39.0 37.9  Platelets 150 - 400 K/uL 260 238 254     CMP Latest Ref Rng & Units 04/08/2019 10/08/2018 05/08/2018  Glucose 70 - 99 mg/dL 153(H) 144(H) 122(H)  BUN 8 - 23 mg/dL _0 Creatinine 0.44 - 1.00 mg/dL 0.83 0.84 0.81  Sodium 135 - 145 mmol/L 138 137 140  Potassium 3.5 - 5.1 mmol/L 4.5 4.4 4.5  Chloride 98 - 111 mmol/L 104 105 104  CO2 22 - 32 mmol/L _1 Calcium 8.9 - 10.3 mg/dL 9.4 9.4 9.7  Total Protein 6.5 - 8.1 g/dL 7.5 7.4 7.4  Total Bilirubin 0.3 -  1.2 mg/dL 0.6 0.5 0.4  Alkaline Phos 38 - 126 U/L 94 89 90  AST 15 - 41 U/L _2 ALT 0 - 44 U/L 32 22 18      RADIOGRAPHIC STUDIES: I have personally reviewed the radiological images as listed and agreed with the findings in the report. No results found.   ASSESSMENT & PLAN:  Crystal Logan is a 68 y.o. female with   1. 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  -She started antiestrogen therapy with letrozole in 04/2017. Due to weight gain, anxiety and trouble sleeping, I switched her to  Anastrozole in 05/2018.  -She tolerates Anastrozole better but still continues to gain weight, have joint pain and has manageable hot flashes. I reviewed diet change. We discussed switching anastrozole to tamoxifen, she declined. Given her side effects will continue for 5 years.  -She is clinically doing well. Lab reviewed, her CBC and CMP are within normal limits except BG 153. Her physical exam and her 03/2019 mammogram were unremarkable except skin erythema of right breast. I advised her to watch for signs of infection if her breast become warm, tender or she's febrile. There is no clinical concern for recurrence. -Continue breast cancer surveillance -Continue Anastrozole    2. Tobacco use - She has been smoking since 68 years-old. She previously smoked 1 ppd but has cut down. -She has stopped smoking previously.   3. HTN -She'll continue medication and follow-up with her primary care physician -On Benicar, will continue  -BP at 146/76 today (04/08/19).    4. Bone Health  -We discussed his risk of osteopenia and osteoporosis from aromatase inhibitor -Baseline DEXA from 03/2018 was normal. Will repeat in 03/2020.   5. Obesity, weight gain, arthralgia  -Her increased appetite, weight  gain and joint pain is related to her Anastrozole -I advised her to stop all sweats and junk food and to reduce her red meat intake and reduce calorie and carbohydrates in diet as she has increased appetite. I discussed eating more cucumber, tomatoes and other low calorie veggies when she is hungry.  -I also encouraged her to exercise daily.  -She should f/u with her PCP to evaluate her knee as this can be related to arthritis as well.   Plan:  Lab and f/u in 6 months  Continue Anastrozole    No problem-specific Assessment & Plan notes found for this encounter.   No orders of the defined types were placed in this encounter.  All questions were answered. The patient knows to call the clinic with any problems, questions or concerns. No barriers to learning was detected. I spent 20 minutes counseling the patient face to face. The total time spent in the appointment was 25 minutes and more than 50% was on counseling and review of test results     Truitt Merle, MD 04/08/2019   I, Joslyn Devon, am acting as scribe for Truitt Merle, MD.   I have reviewed the above documentation for accuracy and completeness, and I agree with the above.

## 2019-04-08 ENCOUNTER — Telehealth: Payer: Self-pay | Admitting: Hematology

## 2019-04-08 ENCOUNTER — Other Ambulatory Visit: Payer: Self-pay

## 2019-04-08 ENCOUNTER — Inpatient Hospital Stay (HOSPITAL_BASED_OUTPATIENT_CLINIC_OR_DEPARTMENT_OTHER): Payer: Medicare Other | Admitting: Hematology

## 2019-04-08 ENCOUNTER — Inpatient Hospital Stay: Payer: Medicare Other | Attending: Hematology

## 2019-04-08 ENCOUNTER — Encounter: Payer: Self-pay | Admitting: Hematology

## 2019-04-08 VITALS — BP 146/76 | HR 73 | Temp 97.4°F | Resp 18 | Ht 64.0 in | Wt 235.2 lb

## 2019-04-08 DIAGNOSIS — I10 Essential (primary) hypertension: Secondary | ICD-10-CM | POA: Diagnosis not present

## 2019-04-08 DIAGNOSIS — M25541 Pain in joints of right hand: Secondary | ICD-10-CM

## 2019-04-08 DIAGNOSIS — Z170 Estrogen receptor positive status [ER+]: Secondary | ICD-10-CM

## 2019-04-08 DIAGNOSIS — C50211 Malignant neoplasm of upper-inner quadrant of right female breast: Secondary | ICD-10-CM

## 2019-04-08 DIAGNOSIS — C773 Secondary and unspecified malignant neoplasm of axilla and upper limb lymph nodes: Secondary | ICD-10-CM | POA: Diagnosis not present

## 2019-04-08 DIAGNOSIS — M545 Low back pain: Secondary | ICD-10-CM

## 2019-04-08 DIAGNOSIS — Z79811 Long term (current) use of aromatase inhibitors: Secondary | ICD-10-CM | POA: Diagnosis not present

## 2019-04-08 DIAGNOSIS — R5383 Other fatigue: Secondary | ICD-10-CM

## 2019-04-08 DIAGNOSIS — Z720 Tobacco use: Secondary | ICD-10-CM

## 2019-04-08 DIAGNOSIS — E669 Obesity, unspecified: Secondary | ICD-10-CM

## 2019-04-08 DIAGNOSIS — R635 Abnormal weight gain: Secondary | ICD-10-CM

## 2019-04-08 DIAGNOSIS — M25542 Pain in joints of left hand: Secondary | ICD-10-CM

## 2019-04-08 DIAGNOSIS — M25569 Pain in unspecified knee: Secondary | ICD-10-CM

## 2019-04-08 LAB — COMPREHENSIVE METABOLIC PANEL
ALT: 32 U/L (ref 0–44)
AST: 22 U/L (ref 15–41)
Albumin: 4 g/dL (ref 3.5–5.0)
Alkaline Phosphatase: 94 U/L (ref 38–126)
Anion gap: 10 (ref 5–15)
BUN: 22 mg/dL (ref 8–23)
CO2: 24 mmol/L (ref 22–32)
Calcium: 9.4 mg/dL (ref 8.9–10.3)
Chloride: 104 mmol/L (ref 98–111)
Creatinine, Ser: 0.83 mg/dL (ref 0.44–1.00)
GFR calc Af Amer: >60 mL/min (ref >60)
GFR calc non Af Amer: >60 mL/min (ref >60)
Glucose, Bld: 153 mg/dL — ABNORMAL HIGH (ref 70–99)
Potassium: 4.5 mmol/L (ref 3.5–5.1)
Sodium: 138 mmol/L (ref 135–145)
Total Bilirubin: 0.6 mg/dL (ref 0.3–1.2)
Total Protein: 7.5 g/dL (ref 6.5–8.1)

## 2019-04-08 LAB — CBC WITH DIFFERENTIAL/PLATELET
Abs Immature Granulocytes: 0.02 10*3/uL (ref 0.00–0.07)
Basophils Absolute: 0 10*3/uL (ref 0.0–0.1)
Basophils Relative: 1 %
Eosinophils Absolute: 0.1 10*3/uL (ref 0.0–0.5)
Eosinophils Relative: 2 %
HCT: 40.2 % (ref 36.0–46.0)
Hemoglobin: 13.1 g/dL (ref 12.0–15.0)
Immature Granulocytes: 0 %
Lymphocytes Relative: 28 %
Lymphs Abs: 1.5 10*3/uL (ref 0.7–4.0)
MCH: 29.4 pg (ref 26.0–34.0)
MCHC: 32.6 g/dL (ref 30.0–36.0)
MCV: 90.3 fL (ref 80.0–100.0)
Monocytes Absolute: 0.8 10*3/uL (ref 0.1–1.0)
Monocytes Relative: 14 %
Neutro Abs: 2.9 10*3/uL (ref 1.7–7.7)
Neutrophils Relative %: 55 %
Platelets: 260 10*3/uL (ref 150–400)
RBC: 4.45 MIL/uL (ref 3.87–5.11)
RDW: 12.8 % (ref 11.5–15.5)
WBC: 5.3 10*3/uL (ref 4.0–10.5)
nRBC: 0 % (ref 0.0–0.2)

## 2019-04-08 MED ORDER — ANASTROZOLE 1 MG PO TABS: 1.0000 mg | ORAL_TABLET | Freq: Every day | ORAL | 5 refills | Status: DC

## 2019-04-08 NOTE — Telephone Encounter (Signed)
Gave avs and calendar ° °

## 2019-10-05 NOTE — Progress Notes (Signed)
Crystal Logan   [phone] Fax:(336) [phone]   Clinic Follow up Note   Patient Care Team: Jilda Panda, MD as PCP - General (Internal Medicine) Alphonsa Overall, MD as Consulting Physician (General Surgery) Truitt Merle, MD as Consulting Physician (Hematology) Eppie Gibson, MD as Attending Physician (Radiation Oncology) Gardenia Phlegm, NP as Nurse Practitioner (Hematology and Oncology)  Date of Service:  10/09/2019  CHIEF COMPLAINT: Follow up right breast cancer  SUMMARY OF ONCOLOGIC HISTORY: Oncology History Overview Note  Cancer Staging Malignant neoplasm of upper-inner quadrant of right breast in female, estrogen receptor positive ([place]) Staging form: Breast, AJCC 8th Edition - Clinical stage from 11/27/2016: Stage IA (cT1c, cN0, cM0, G2, ER: Positive, PR: Positive, HER2: Negative) - Signed by Truitt Merle, MD on 12/05/2016     Malignant neoplasm of upper-inner quadrant of right breast in female, estrogen receptor positive ([place])  11/16/2016 Mammogram   Diagnostic mammogram and US showed suspicious asymmetry/distortion within the inner right breast, 3 o'clock axis region, at middle depth, without definite sonographic Correlated, axilla (-)   11/27/2016 Receptors her2   ER 90%+ PR 40%+ HER2 - Ki67 5%   11/27/2016 Initial Biopsy   Breast, right, needle core biopsy, 3:00 o'clock - INVASIVE LOBULAR CARCINOMA, G1-2   11/27/2016 Initial Diagnosis   Malignant neoplasm of upper-inner quadrant of right breast in female, estrogen receptor positive ([place])   12/10/2016 Imaging   MR Breast Bilateral IMPRESSION: 1. Post biopsy changes and associated non masslike enhancement in the medial aspect of the right breast. 2. Left breast is negative. 3. No MRI evidence for adenopathy.   12/26/2016 Procedure   Colonoscopy  - Three 3 to 6 mm polyps in the descending colon, removed with a cold snare. Resected and retrieved. - The examination was otherwise normal on  direct and retroflexion views.   12/26/2016 Pathology Results   Diagnosis Surgical [P], descending, polyps (3) -TUBULAR ADENOMAS. -NO HIGH GRADE DYSPLASIA OR MALIGNANCY IDENTIFIED.   01/02/2017 Surgery   Right Breast lumpectomy with sentinel lymph node biopsy performed by Dr. Lucia Gaskins.   01/02/2017 Pathology Results   Diagnosis  1. Breast, lumpectomy, Right - INVASIVE LOBULAR CARCINOMA, 2.8 CM. - INVASIVE CARCINOMA BROADLY INVOLVES INFERIOR MARGIN. - PREVIOUS BIOPSY SITE. - FOCAL LYMPH VASCULAR INVOLVEMENT BY TUMOR. 2. Lymph node, sentinel, biopsy, Right Axillary - METASTATIC CARCINOMA IN ONE LYMPH NODE WITH EXTRANODAL EXTENSION (1/1).   01/18/2017 Surgery   RE-EXCISION OF BREAST LUMPECTOMY by Dr. Lucia Gaskins    01/18/2017 Pathology Results   Diagnosis 01/18/17 Breast, excision, Right Inferior Margin - ATYPICAL DUCTAL HYPERPLASIA. - FIBROCYSTIC CHANGES WITH CALCIFICATIONS. - VASCULAR CALCIFICATIONS. - SEE COMMENT. Microscopic Comment The surgical resection margin(s) of the specimen were inked and microscopically evaluated     - 03/20/2017 Radiation Therapy   Radiation by Dr. Isidore Moos    04/2017 -  Anti-estrogen oral therapy   Letrozole daily started in 04/2017. Due to anxiety, weight gain and troule sleeping I swithced her to Anastrozole in 05/2018.    03/24/2018 Imaging   03/24/2018 Bone Density ASSESSMENT: The BMD measured at Femur Neck Left is 0.944 g/cm2 with a T-score of -0.7. This patient is considered normal according to [facility]) criteria.   03/24/2018 Mammogram   03/24/2018 Mammogram IMPRESSION: Expected surgical changes in the medial central right breast. No mammographic evidence of malignancy in the bilateral breasts.   03/24/2018 Imaging   DEXA ASSESSMENT: The BMD measured at Femur Neck Left is 0.944 g/cm2 with a T-score of -0.7. This  patient is considered normal according to [facility]) criteria.  The scan quality is good.  L-3, L-4 were excluded due to degenerative changes.  Site Region Measured Date Measured Age YA BMD Significant CHANGE T-score AP Spine  L1-L2     3/24/2018    [age]         -0.5    1.110 g/cm2  DualFemur Neck Left 3/24/2018    67.2         -0.7    0.944 g/cm2  World Health Organization [facility]) criteria for post-menopausal, Caucasian Women: Normal       T-score at or above -1 SD Osteopenia   T-score between -1 and -2.5 SD Osteoporosis T-score at or below -2.5 SD  RECOMMENDATION: 1. All patients should optimize calcium and vitamin D intake. 2. Consider FDA approved medical therapies in postmenopausal women and men aged 58 years and older, based on the following: a. A hip or vertebral (clinical or morphometric) fracture b. T- score < or = -2.5 at the femoral neck or spine after appropriate evaluation to exclude secondary causes c. Low bone mass (T-score between -1.0 and -2.5 at the femoral neck or spine) and a 10 year probability of a hip fracture > or = 3% or a 10 year probability of a major osteoporosis-related fracture > or = 20% based on the [place]-adapted WHO algorithm d. Clinician judgment and/or patient preferences may indicate treatment for people with 10-year fracture probabilities above or below these levels      CURRENT THERAPY:  Letrozole once daily for 7 years starting 04/10/17. Change to anastrozole in 05/2018  INTERVAL HISTORY:  Crystal Logan is here for a follow up of right breast cancer. She was last seen by me 6 months ago. She presents to the clinic with her husband. Her husband notes for the last 10 morning she has had big headache and BP 150 range. She has changed diet and has lost some weight. Per her husband she is confused and does not think she should eat if her BP is elevated.  She is on Benicar for her HTN. She has been having mild cough, no congestion, taste, hearing or smell.  Per her husband she is stressed about traveling to [place] on 11/24/19.  He notes she does not want COVID19 vaccine, but her husband wants her to.    REVIEW OF SYSTEMS:  Constitutional: Denies fevers, chills (+) Purposeful weight loss (+) HA b/l and posterior  Eyes: Denies blurriness of vision Ears, nose, mouth, throat, and face: Denies mucositis or sore throat Respiratory: Denies cough, dyspnea or wheezes Cardiovascular: Denies palpitation, chest discomfort or lower extremity swelling Gastrointestinal:  Denies nausea, heartburn or change in bowel habits Skin: Denies abnormal skin rashes Lymphatics: Denies new lymphadenopathy or easy bruising Neurological:Denies numbness, tingling or new weaknesses Behavioral/Psych: Mood is stable, no new changes  All other systems were reviewed with the patient and are negative.  MEDICAL HISTORY:  Past Medical History:  Diagnosis Date  . Breast cancer ([place]) 12/2016   right  . History of radiation therapy 02/13/17- 03/20/17   Right Breast treated to 50 Gy in 25 fractions  . Hypertension    states under control with med., has been on med. ~ 15 yr.  . Personal history of radiation therapy 01/08/2017   Ended 02-01-2017    SURGICAL HISTORY: Past Surgical History:  Procedure Laterality Date  . BREAST LUMPECTOMY Right 01/02/2017  . BREAST LUMPECTOMY WITH RADIOACTIVE SEED AND SENTINEL LYMPH NODE  BIOPSY Right 01/02/2017   Procedure: RIGHT BREAST LUMPECTOMY WITH RADIOACTIVE SEED AND RIGHT AXILLARY SENTINEL LYMPH NODE BIOPSY;  Surgeon: Alphonsa Overall, MD;  Location: [place];  Service: General;  Laterality: Right;  . RE-EXCISION OF BREAST LUMPECTOMY Right 01/18/2017   Procedure: RE-EXCISION OF BREAST LUMPECTOMY;  Surgeon: Alphonsa Overall, MD;  Location: [facility];  Service: General;  Laterality: Right;  . TONSILLECTOMY      I have reviewed the social history and family history with the patient and they are unchanged from previous note.  ALLERGIES:  is allergic to no known allergies.  MEDICATIONS:  Current Outpatient  Medications  Medication Sig Dispense Refill  . anastrozole (ARIMIDEX) 1 MG tablet Take 1 tablet (1 mg total) by mouth daily. 30 tablet 5  . olmesartan-hydrochlorothiazide (BENICAR HCT) 20-12.5 MG tablet Take 1 tablet by mouth daily.     No current facility-administered medications for this visit.    PHYSICAL EXAMINATION: ECOG PERFORMANCE STATUS: 0 - Asymptomatic  Vitals:   10/09/19 0927  BP: (!) 150/86  Pulse: 88  Resp: 18  Temp: 97.8 F (36.6 C)  SpO2: 98%   There were no vitals filed for this visit.  GENERAL:alert, no distress and comfortable SKIN: skin color, texture, turgor are normal, no rashes or significant lesions EYES: normal, Conjunctiva are pink and non-injected, sclera clear  NECK: supple, thyroid normal size, non-tender, without nodularity LYMPH:  no palpable lymphadenopathy in the cervical, axillary  LUNGS: clear to auscultation and percussion with normal breathing effort HEART: regular rate & rhythm and no murmurs and no lower extremity edema ABDOMEN:abdomen soft, non-tender and normal bowel sounds Musculoskeletal:no cyanosis of digits and no clubbing  NEURO: alert & oriented x 3 with fluent speech, no focal motor/sensory deficits BREAST: S/p right lumpectomy: Surgical incision healed well (+) Skin retraction and scar tissue around incision. No palpable mass, nodules or adenopathy bilaterally. Breast exam benign.   LABORATORY DATA:  I have reviewed the data as listed CBC Latest Ref Rng & Units 10/09/2019 04/08/2019 10/08/2018  WBC 4.0 - 10.5 K/uL 6.0 5.3 4.9  Hemoglobin 12.0 - 15.0 g/dL 12.9 13.1 12.9  Hematocrit 36.0 - 46.0 % 39.6 40.2 39.0  Platelets 150 - 400 K/uL 256 260 238     CMP Latest Ref Rng & Units 10/09/2019 04/08/2019 10/08/2018  Glucose 70 - 99 mg/dL 125(H) 153(H) 144(H)  BUN 8 - 23 mg/dL _0 Creatinine 0.44 - 1.00 mg/dL 0.82 0.83 0.84  Sodium 135 - 145 mmol/L 138 138 137  Potassium 3.5 - 5.1 mmol/L 4.6 4.5 4.4  Chloride 98 - 111 mmol/L  104 104 105  CO2 22 - 32 mmol/L _1 Calcium 8.9 - 10.3 mg/dL 9.1 9.4 9.4  Total Protein 6.5 - 8.1 g/dL 7.5 7.5 7.4  Total Bilirubin 0.3 - 1.2 mg/dL 0.3 0.6 0.5  Alkaline Phos 38 - 126 U/L 87 94 89  AST 15 - 41 U/L _2 ALT 0 - 44 U/L 29 32 22      RADIOGRAPHIC STUDIES: I have personally reviewed the radiological images as listed and agreed with the findings in the report. No results found.   ASSESSMENT & PLAN:  Shakela Donati is a 69 y.o. female with    1. 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  -She started antiestrogen therapy with letrozole in 04/2017. Due to weight gain, anxiety and trouble sleeping, I switched her to Anastrozolein 05/2018.  -She tolerates Anastrozolebetterbut still continues to gain weight, have joint pain and has manageable hot flashes. I reviewed diet change. We discussed switching anastrozole to tamoxifen, she declined. Given her side effects, will likely continue for 5 years.  -From a breast cancer standpoint she is clinically doing well. Lab reviewed, her CBC and CMP are within normal limits. Her physical exam and her 03/2019 mammogram were unremarkable. There is no clinical concern for recurrence. -She is 3 years since her cancer diagnosis. Her risk of recurrence has significantly decreased. Continue breast cancer surveillance. Next mammogram in 03/2020  -Continue Anastrozole, she is overall tolerating well, has lost some weight  -F/u in 6 months    2. Tobacco use -She has been smoking since 69 years-old. She previously smoked 1 ppd but has cut down. -She has stopped smoking previously.  -I encouraged her to proceed with COVID19 vaccination. She is apprehensive. I discussed with her smoking history COVID19 could be significant for  her. Her husband encouraged the vaccine as well. I reviewed possible Vaccine side effects with her. I also encouraged her to continue COVID19 precautions, especially if she intends to internationally travel this year.   3. HTN, Recent Headaches  -She'll continue medication and follow-up with her primary care physician. On Benicar.  -Her BP has been elevated lately and has been having b/l and occipital HA in the morning recently per her husband. He feels this is stress related.  -I encouraged her to drink more water and reduce salt in diet.  -For her HA's I recommend adequate sleep, eat and drinking. I also encouraged her to reduce stress.   4. Bone Health  -We discussed his risk of osteopenia and osteoporosis from aromatase inhibitor -Baseline DEXA from 03/2018 was normal. Will repeat in 03/2020.   5. Obesity, weight gain, arthralgia  -Her increased appetite, weight gain and joint pain is related to her Anastrozole -She should f/u with her PCP to evaluate her knee as this can be related to arthritis as well.  -She has changed her diet and has been able to lose weight. I recommend she continue but eat adequately with healthy meal and less salt. I also encouraged her to exercise.   Plan: -She requests letter for travel to [place], I wrote for her today   -Mammogram and DEXA in 03/2020  -Lab and f/u in 6 months  -Continue Anastrozole, refilled today    No problem-specific Assessment & Plan notes found for this encounter.   Orders Placed This Encounter  Procedures  . MM DIAG BREAST TOMO BILATERAL    Standing Status:   Future    Standing Expiration Date:   10/08/2020    Order Specific Question:   Reason for Exam (SYMPTOM  OR DIAGNOSIS REQUIRED)    Answer:   screening    Order Specific Question:   Preferred imaging location?    Answer:   [facility]  . DG Bone Density    Standing Status:   Future    Standing Expiration Date:   10/08/2020    Order Specific Question:   Reason for  Exam (SYMPTOM  OR DIAGNOSIS REQUIRED)    Answer:   screnning    Order Specific Question:   Preferred imaging location?    Answer:   [facility]   All questions were answered. The patient knows to  call the clinic with any problems, questions or concerns. No barriers to learning was detected. The total time spent in the appointment was 30 minutes.     Truitt Merle, MD 10/09/2019   I, Joslyn Devon, am acting as scribe for Truitt Merle, MD.   I have reviewed the above documentation for accuracy and completeness, and I agree with the above.

## 2019-10-09 ENCOUNTER — Inpatient Hospital Stay: Payer: Medicare Other | Attending: Hematology | Admitting: Hematology

## 2019-10-09 ENCOUNTER — Encounter: Payer: Self-pay | Admitting: Hematology

## 2019-10-09 ENCOUNTER — Other Ambulatory Visit: Payer: Self-pay

## 2019-10-09 ENCOUNTER — Inpatient Hospital Stay: Payer: Medicare Other

## 2019-10-09 VITALS — BP 150/86 | HR 88 | Temp 97.8°F | Resp 18 | Ht 64.0 in

## 2019-10-09 DIAGNOSIS — C50211 Malignant neoplasm of upper-inner quadrant of right female breast: Secondary | ICD-10-CM | POA: Diagnosis present

## 2019-10-09 DIAGNOSIS — C773 Secondary and unspecified malignant neoplasm of axilla and upper limb lymph nodes: Secondary | ICD-10-CM | POA: Insufficient documentation

## 2019-10-09 DIAGNOSIS — M2550 Pain in unspecified joint: Secondary | ICD-10-CM | POA: Diagnosis not present

## 2019-10-09 DIAGNOSIS — Z87891 Personal history of nicotine dependence: Secondary | ICD-10-CM | POA: Diagnosis not present

## 2019-10-09 DIAGNOSIS — N951 Menopausal and female climacteric states: Secondary | ICD-10-CM | POA: Insufficient documentation

## 2019-10-09 DIAGNOSIS — I10 Essential (primary) hypertension: Secondary | ICD-10-CM | POA: Diagnosis not present

## 2019-10-09 DIAGNOSIS — E2839 Other primary ovarian failure: Secondary | ICD-10-CM | POA: Diagnosis not present

## 2019-10-09 DIAGNOSIS — Z170 Estrogen receptor positive status [ER+]: Secondary | ICD-10-CM

## 2019-10-09 DIAGNOSIS — E669 Obesity, unspecified: Secondary | ICD-10-CM | POA: Insufficient documentation

## 2019-10-09 LAB — COMPREHENSIVE METABOLIC PANEL
ALT: 29 U/L (ref 0–44)
AST: 16 U/L (ref 15–41)
Albumin: 4.1 g/dL (ref 3.5–5.0)
Alkaline Phosphatase: 87 U/L (ref 38–126)
Anion gap: 9 (ref 5–15)
BUN: 14 mg/dL (ref 8–23)
CO2: 25 mmol/L (ref 22–32)
Calcium: 9.1 mg/dL (ref 8.9–10.3)
Chloride: 104 mmol/L (ref 98–111)
Creatinine, Ser: 0.82 mg/dL (ref 0.44–1.00)
GFR calc Af Amer: >60 mL/min (ref >60)
GFR calc non Af Amer: >60 mL/min (ref >60)
Glucose, Bld: 125 mg/dL — ABNORMAL HIGH (ref 70–99)
Potassium: 4.6 mmol/L (ref 3.5–5.1)
Sodium: 138 mmol/L (ref 135–145)
Total Bilirubin: 0.3 mg/dL (ref 0.3–1.2)
Total Protein: 7.5 g/dL (ref 6.5–8.1)

## 2019-10-09 LAB — CBC WITH DIFFERENTIAL/PLATELET
Abs Immature Granulocytes: 0.01 10*3/uL (ref 0.00–0.07)
Basophils Absolute: 0.1 10*3/uL (ref 0.0–0.1)
Basophils Relative: 1 %
Eosinophils Absolute: 0.1 10*3/uL (ref 0.0–0.5)
Eosinophils Relative: 2 %
HCT: 39.6 % (ref 36.0–46.0)
Hemoglobin: 12.9 g/dL (ref 12.0–15.0)
Immature Granulocytes: 0 %
Lymphocytes Relative: 22 %
Lymphs Abs: 1.3 10*3/uL (ref 0.7–4.0)
MCH: 29.2 pg (ref 26.0–34.0)
MCHC: 32.6 g/dL (ref 30.0–36.0)
MCV: 89.6 fL (ref 80.0–100.0)
Monocytes Absolute: 0.7 10*3/uL (ref 0.1–1.0)
Monocytes Relative: 11 %
Neutro Abs: 3.8 10*3/uL (ref 1.7–7.7)
Neutrophils Relative %: 64 %
Platelets: 256 10*3/uL (ref 150–400)
RBC: 4.42 MIL/uL (ref 3.87–5.11)
RDW: 13 % (ref 11.5–15.5)
WBC: 6 10*3/uL (ref 4.0–10.5)
nRBC: 0 % (ref 0.0–0.2)

## 2019-10-09 MED ORDER — ANASTROZOLE 1 MG PO TABS: 1.0000 mg | ORAL_TABLET | Freq: Every day | ORAL | 5 refills | Status: DC

## 2019-10-19 ENCOUNTER — Ambulatory Visit: Payer: Medicare Other | Attending: Internal Medicine

## 2019-10-19 DIAGNOSIS — Z23 Encounter for immunization: Secondary | ICD-10-CM | POA: Insufficient documentation

## 2019-10-19 NOTE — Progress Notes (Signed)
   Covid-19 Vaccination Clinic  Name:  Crystal Logan    MRN: [id number] DOB: 1950/09/13  10/19/2019  Ms. Sandru Rus was observed post Covid-19 immunization for 15 minutes without incidence. She was provided with Vaccine Information Sheet and instruction to access the V-Safe system.   Ms. Rhondalyn Manigault was instructed to call 911 with any severe reactions post vaccine: Marland Kitchen Difficulty breathing  . Swelling of your face and throat  . A fast heartbeat  . A bad rash all over your body  . Dizziness and weakness    Immunizations Administered    Name Date Dose VIS Date Route   Pfizer COVID-19 Vaccine 10/19/2019  4:41 PM 0.3 mL 08/21/2019 Intramuscular   Manufacturer: [place]   Lot: [id number]   [place]: [id number]

## 2019-11-13 ENCOUNTER — Ambulatory Visit: Payer: Medicare Other | Attending: Internal Medicine

## 2019-11-13 DIAGNOSIS — Z23 Encounter for immunization: Secondary | ICD-10-CM

## 2019-11-13 NOTE — Progress Notes (Signed)
   Covid-19 Vaccination Clinic  Name:  Crystal Logan    MRN: [id number] DOB: October 14, 1950  11/13/2019  Ms. Crystal Logan was observed post Covid-19 immunization for 15 minutes without incident. She was provided with Vaccine Information Sheet and instruction to access the V-Safe system.   Ms. Crystal Logan was instructed to call 911 with any severe reactions post vaccine: Marland Kitchen Difficulty breathing  . Swelling of face and throat  . A fast heartbeat  . A bad rash all over body  . Dizziness and weakness   Immunizations Administered    Name Date Dose VIS Date Route   Pfizer COVID-19 Vaccine 11/13/2019 12:08 PM 0.3 mL 08/21/2019 Intramuscular   Manufacturer: [place]   Lot: [id number]   [place]: [id number]

## 2019-11-23 ENCOUNTER — Other Ambulatory Visit: Payer: Medicare Other

## 2020-01-26 ENCOUNTER — Encounter: Payer: Self-pay | Admitting: Gastroenterology

## 2020-02-29 ENCOUNTER — Ambulatory Visit (AMBULATORY_SURGERY_CENTER): Payer: Self-pay

## 2020-02-29 ENCOUNTER — Other Ambulatory Visit: Payer: Self-pay

## 2020-02-29 VITALS — Ht 64.0 in | Wt 231.0 lb

## 2020-02-29 DIAGNOSIS — Z86010 Personal history of colonic polyps: Secondary | ICD-10-CM

## 2020-02-29 NOTE — Progress Notes (Signed)
No egg or soy allergy known to patient  No issues with past sedation with any surgeries  or procedures, no intubation problems  No diet pills per patient No home 02 use per patient  No blood thinners per patient  Pt denies issues with constipation  No A fib or A flutter  EMMI video sent to pt's e mail   previsit with husband [name] as [place] Interpreter  Due to the COVID-19 pandemic we are asking patients to follow these guidelines. Please only bring one care partner. Please be aware that your care partner may wait in the car in the parking lot or if they feel like they will be too hot to wait in the car, they may wait in the lobby on the 4th floor. All care partners are required to wear a mask the entire time (we do not have any that we can provide them), they need to practice social distancing, and we will do a Covid check for all patient's and care partners when you arrive. Also we will check their temperature and your temperature. If the care partner waits in their car they need to stay in the parking lot the entire time and we will call them on their cell phone when the patient is ready for discharge so they can bring the car to the front of the building. Also all patient's will need to wear a mask into building.

## 2020-03-11 ENCOUNTER — Encounter: Payer: Self-pay | Admitting: Gastroenterology

## 2020-03-11 ENCOUNTER — Other Ambulatory Visit: Payer: Self-pay

## 2020-03-11 ENCOUNTER — Ambulatory Visit (AMBULATORY_SURGERY_CENTER): Payer: Medicare Other | Admitting: Gastroenterology

## 2020-03-11 VITALS — BP 139/84 | HR 71 | Temp 97.3°F | Resp 13 | Ht 64.0 in | Wt 231.0 lb

## 2020-03-11 DIAGNOSIS — D123 Benign neoplasm of transverse colon: Secondary | ICD-10-CM | POA: Diagnosis not present

## 2020-03-11 DIAGNOSIS — Z86010 Personal history of colonic polyps: Secondary | ICD-10-CM | POA: Diagnosis not present

## 2020-03-11 DIAGNOSIS — D122 Benign neoplasm of ascending colon: Secondary | ICD-10-CM

## 2020-03-11 MED ORDER — SODIUM CHLORIDE 0.9 % IV SOLN
500.0000 mL | Freq: Once | INTRAVENOUS | Status: DC
Start: 2020-03-11 — End: 2020-03-11

## 2020-03-11 NOTE — Progress Notes (Signed)
Called to room to assist during endoscopic procedure.  Patient ID and intended procedure confirmed with present staff. Received instructions for my participation in the procedure from the performing physician.  

## 2020-03-11 NOTE — Patient Instructions (Signed)
HANDOUTS PROVIDED ON: POLYPS & HEMORRHOIDS ° °The polyps removed today have been sent for pathology.  The results can take 1-3 weeks to receive.  When your next colonoscopy should occur will be based on the pathology results.   ° °You may resume your previous diet and medication schedule. ° °Thank you for allowing us to care for you today!!! ° ° °YOU HAD AN ENDOSCOPIC PROCEDURE TODAY AT THE  ENDOSCOPY CENTER:   Refer to the procedure report that was given to you for any specific questions about what was found during the examination.  If the procedure report does not answer your questions, please call your gastroenterologist to clarify.  If you requested that your care partner not be given the details of your procedure findings, then the procedure report has been included in a sealed envelope for you to review at your convenience later. ° °YOU SHOULD EXPECT: Some feelings of bloating in the abdomen. Passage of more gas than usual.  Walking can help get rid of the air that was put into your GI tract during the procedure and reduce the bloating. If you had a lower endoscopy (such as a colonoscopy or flexible sigmoidoscopy) you may notice spotting of blood in your stool or on the toilet paper. If you underwent a bowel prep for your procedure, you may not have a normal bowel movement for a few days. ° °Please Note:  You might notice some irritation and congestion in your nose or some drainage.  This is from the oxygen used during your procedure.  There is no need for concern and it should clear up in a day or so. ° °SYMPTOMS TO REPORT IMMEDIATELY: ° °· Following lower endoscopy (colonoscopy or flexible sigmoidoscopy): ° Excessive amounts of blood in the stool ° Significant tenderness or worsening of abdominal pains ° Swelling of the abdomen that is new, acute ° Fever of 100°F or higher ° °For urgent or emergent issues, a gastroenterologist can be reached at any hour by calling [phone]. °Do not use MyChart  messaging for urgent concerns.  ° ° °DIET:  We do recommend a small meal at first, but then you may proceed to your regular diet.  Drink plenty of fluids but you should avoid alcoholic beverages for 24 hours. ° °ACTIVITY:  You should plan to take it easy for the rest of today and you should NOT DRIVE or use heavy machinery until tomorrow (because of the sedation medicines used during the test).   ° °FOLLOW UP: °Our staff will call the number listed on your records 48-72 hours following your procedure to check on you and address any questions or concerns that you may have regarding the information given to you following your procedure. If we do not reach you, we will leave a message.  We will attempt to reach you two times.  During this call, we will ask if you have developed any symptoms of COVID 19. If you develop any symptoms (ie: fever, flu-like symptoms, shortness of breath, cough etc.) before then, please call [phone].  If you test positive for Covid 19 in the 2 weeks post procedure, please call and report this information to us.   ° °If any biopsies were taken you will be contacted by phone or by letter within the next 1-3 weeks.  Please call us at [phone] if you have not heard about the biopsies in 3 weeks.  ° ° °SIGNATURES/CONFIDENTIALITY: °You and/or your care partner have signed paperwork which will be   entered into your electronic medical record.  These signatures attest to the fact that that the information above on your After Visit Summary has been reviewed and is understood.  Full responsibility of the confidentiality of this discharge information lies with you and/or your care-partner. °

## 2020-03-11 NOTE — Progress Notes (Signed)
VS-CW  Pt's states no medical or surgical changes since previsit or office visit.   Husband, [name], interpreting. No other interpreter available.

## 2020-03-11 NOTE — Op Note (Signed)
[facility] Patient Name: Crystal Logan Procedure Date: 03/11/2020 1:28 PM MRN: [id number] Endoscopist: Milus Banister , MD Age: 69 Referring MD:  Date of Birth: [birth year] Gender: Female Account #: [id number] Procedure:                Colonoscopy Indications:              High risk colon cancer surveillance: Personal                            history of colonic polyps; colonoscopy 2018 three                            subCM adenomas removed Medicines:                Monitored Anesthesia Care Procedure:                Pre-Anesthesia Assessment:                           - Prior to the procedure, a History and Physical                            was performed, and patient medications and                            allergies were reviewed. The patient's tolerance of                            previous anesthesia was also reviewed. The risks                            and benefits of the procedure and the sedation                            options and risks were discussed with the patient.                            All questions were answered, and informed consent                            was obtained. Prior Anticoagulants: The patient has                            taken no previous anticoagulant or antiplatelet                            agents. ASA Grade Assessment: II - A patient with                            mild systemic disease. After reviewing the risks                            and benefits, the patient was deemed in  satisfactory condition to undergo the procedure.                           After obtaining informed consent, the colonoscope                            was passed under direct vision. Throughout the                            procedure, the patient's blood pressure, pulse, and                            oxygen saturations were monitored continuously. The                            Colonoscope was introduced through  the anus and                            advanced to the the cecum, identified by                            appendiceal orifice and ileocecal valve. The                            colonoscopy was performed without difficulty. The                            patient tolerated the procedure well. The quality                            of the bowel preparation was good. The ileocecal                            valve, appendiceal orifice, and rectum were                            photographed. Scope In: 1:31:45 PM Scope Out: 1:43:22 PM Scope Withdrawal Time: 0 hours 9 minutes 6 seconds  Total Procedure Duration: 0 hours 11 minutes 37 seconds  Findings:                 A 1 mm polyp was found in the ascending colon. The                            polyp was sessile. The polyp was removed with a                            cold biopsy forceps. Resection and retrieval were                            complete.                           Two sessile polyps were found in the transverse  colon. The polyps were 2 to 4 mm in size. These                            polyps were removed with a cold snare. Resection                            and retrieval were complete.                           External and internal hemorrhoids were found. The                            hemorrhoids were medium-sized.                           The exam was otherwise without abnormality on                            direct and retroflexion views. Complications:            No immediate complications. Estimated blood loss:                            None. Estimated Blood Loss:     Estimated blood loss: none. Impression:               - One 1 mm polyp in the ascending colon, removed                            with a cold biopsy forceps. Resected and retrieved.                           - Two 2 to 4 mm polyps in the transverse colon,                            removed with a cold snare. Resected and  retrieved.                           - External and internal hemorrhoids.                           - The examination was otherwise normal on direct                            and retroflexion views. Recommendation:           - Patient has a contact number available for                            emergencies. The signs and symptoms of potential                            delayed complications were discussed with the  patient. Return to normal activities tomorrow.                            Written discharge instructions were provided to the                            patient.                           - Resume previous diet.                           - Continue present medications.                           - Await pathology results. Milus Banister, MD 03/11/2020 1:45:40 PM This report has been signed electronically.

## 2020-03-11 NOTE — Progress Notes (Signed)
pt tolerated well. VSS. awake and to recovery. Report given to RN.  

## 2020-03-16 ENCOUNTER — Telehealth: Payer: Self-pay

## 2020-03-16 NOTE — Telephone Encounter (Signed)
  Follow up Call-  Call back number 03/11/2020  Post procedure Call Back phone  # [phone]  Permission to leave phone message Yes  Some recent data might be hidden     Patient questions:  Do you have a fever, pain , or abdominal swelling? No. Pain Score  0 *  Have you tolerated food without any problems? Yes.    Have you been able to return to your normal activities? Yes.    Do you have any questions about your discharge instructions: Diet   No. Medications  No. Follow up visit  No.  Do you have questions or concerns about your Care? No.  Actions: * If pain score is 4 or above: No action needed, pain <4.  1. Have you developed a fever since your procedure? no  2.   Have you had an respiratory symptoms (SOB or cough) since your procedure? no  3.   Have you tested positive for COVID 19 since your procedure no  4.   Have you had any family members/close contacts diagnosed with the COVID 19 since your procedure?  no   If yes to any of these questions please route to Joylene John, RN and Erenest Rasher, RN

## 2020-03-17 ENCOUNTER — Encounter: Payer: Self-pay | Admitting: Gastroenterology

## 2020-03-28 ENCOUNTER — Ambulatory Visit
Admission: RE | Admit: 2020-03-28 | Discharge: 2020-03-28 | Disposition: A | Discharge status: Home / Self Care | Payer: Medicare Other | Source: Ambulatory Visit | Attending: Hematology | Admitting: Hematology

## 2020-03-28 ENCOUNTER — Other Ambulatory Visit: Payer: Self-pay

## 2020-03-28 DIAGNOSIS — Z170 Estrogen receptor positive status [ER+]: Secondary | ICD-10-CM

## 2020-03-31 NOTE — Progress Notes (Signed)
[facility]   Telephone:([phone]) [phone] Fax:(336) [phone]   Clinic Follow up Note   Patient Care Team: Crystal Panda, MD as PCP - General (Internal Medicine) Crystal Overall, MD as Consulting Physician (General Surgery) Crystal Merle, MD as Consulting Physician (Hematology) Crystal Gibson, MD as Attending Physician (Radiation Oncology) Crystal Phlegm, NP as Nurse Practitioner (Hematology and Oncology)  Date of Service:  04/06/2020  CHIEF COMPLAINT: Follow up right breast cancer  SUMMARY OF ONCOLOGIC HISTORY: Oncology History Overview Note  Cancer Staging Malignant neoplasm of upper-inner quadrant of right breast in female, estrogen receptor positive ([place]) Staging form: Breast, AJCC 8th Edition - Clinical stage from 11/27/2016: Stage IA (cT1c, cN0, cM0, G2, ER: Positive, PR: Positive, HER2: Negative) - Signed by Crystal Merle, MD on 12/05/2016     Malignant neoplasm of upper-inner quadrant of right breast in female, estrogen receptor positive ([place])  11/16/2016 Mammogram   Diagnostic mammogram and US showed suspicious asymmetry/distortion within the inner right breast, 3 o'clock axis region, at middle depth, without definite sonographic Correlated, axilla (-)   11/27/2016 Receptors her2   ER 90%+ PR 40%+ HER2 - Ki67 5%   11/27/2016 Initial Biopsy   Breast, right, needle core biopsy, 3:00 o'clock - INVASIVE LOBULAR CARCINOMA, G1-2   11/27/2016 Initial Diagnosis   Malignant neoplasm of upper-inner quadrant of right breast in female, estrogen receptor positive ([place])   12/10/2016 Imaging   MR Breast Bilateral IMPRESSION: 1. Post biopsy changes and associated non masslike enhancement in the medial aspect of the right breast. 2. Left breast is negative. 3. No MRI evidence for adenopathy.   12/26/2016 Procedure   Colonoscopy  - Three 3 to 6 mm polyps in the descending colon, removed with a cold snare. Resected and retrieved. - The examination was otherwise normal on  direct and retroflexion views.   12/26/2016 Pathology Results   Diagnosis Surgical [P], descending, polyps (3) -TUBULAR ADENOMAS. -NO HIGH GRADE DYSPLASIA OR MALIGNANCY IDENTIFIED.   01/02/2017 Surgery   Right Breast lumpectomy with sentinel lymph node biopsy performed by Dr. Lucia Logan.   01/02/2017 Pathology Results   Diagnosis  1. Breast, lumpectomy, Right - INVASIVE LOBULAR CARCINOMA, 2.8 CM. - INVASIVE CARCINOMA BROADLY INVOLVES INFERIOR MARGIN. - PREVIOUS BIOPSY SITE. - FOCAL LYMPH VASCULAR INVOLVEMENT BY TUMOR. 2. Lymph node, sentinel, biopsy, Right Axillary - METASTATIC CARCINOMA IN ONE LYMPH NODE WITH EXTRANODAL EXTENSION (1/1).   01/18/2017 Surgery   RE-EXCISION OF BREAST LUMPECTOMY by Dr. Lucia Logan    01/18/2017 Pathology Results   Diagnosis 01/18/17 Breast, excision, Right Inferior Margin - ATYPICAL DUCTAL HYPERPLASIA. - FIBROCYSTIC CHANGES WITH CALCIFICATIONS. - VASCULAR CALCIFICATIONS. - SEE COMMENT. Microscopic Comment The surgical resection margin(s) of the specimen were inked and microscopically evaluated     - 03/20/2017 Radiation Therapy   Radiation by Dr. Isidore Logan    04/2017 -  Anti-estrogen oral therapy   Letrozole daily started in 04/2017. Due to anxiety, weight gain and troule sleeping I swithced her to Anastrozole in 05/2018.    03/24/2018 Imaging   03/24/2018 Bone Density ASSESSMENT: The BMD measured at Femur Neck Left is 0.944 g/cm2 with a T-score of -0.7. This patient is considered normal according to [facility]) criteria.   03/24/2018 Mammogram   03/24/2018 Mammogram IMPRESSION: Expected surgical changes in the medial central right breast. No mammographic evidence of malignancy in the bilateral breasts.   03/24/2018 Imaging   DEXA ASSESSMENT: The BMD measured at Femur Neck Left is 0.944 g/cm2 with a T-score of -0.7. This  patient is considered normal according to [facility]) criteria.  The scan quality is good.  L-3, L-4 were excluded due to degenerative changes.  Site Region Measured Date Measured Age YA BMD Significant CHANGE T-score AP Spine  L1-L2     3/24/2018    67.2         -0.5    1.110 g/cm2  DualFemur Neck Left 3/24/2018    67.2         -0.7    0.944 g/cm2  World Health Organization [facility]) criteria for post-menopausal, Caucasian Women: Normal       T-score at or above -1 SD Osteopenia   T-score between -1 and -2.5 SD Osteoporosis T-score at or below -2.5 SD  RECOMMENDATION: 1. All patients should optimize calcium and vitamin D intake. 2. Consider FDA approved medical therapies in postmenopausal women and men aged 47 years and older, based on the following: a. A hip or vertebral (clinical or morphometric) fracture b. T- score < or = -2.5 at the femoral neck or spine after appropriate evaluation to exclude secondary causes c. Low bone mass (T-score between -1.0 and -2.5 at the femoral neck or spine) and a 10 year probability of a hip fracture > or = 3% or a 10 year probability of a major osteoporosis-related fracture > or = 20% based on the [place]-adapted WHO algorithm d. Clinician judgment and/or patient preferences may indicate treatment for people with 10-year fracture probabilities above or below these levels      CURRENT THERAPY:  Letrozole once daily for 7 years starting 04/10/17. Changed to anastrozole in 05/2018  INTERVAL HISTORY:  Crystal Logan is here for a follow up of right breast cancer. She was last seen by me 6 months ago. She presents to the clinic with her husband who help translate for her. Her husband feels she is eating too much but her weight is stable. She eats every 2 hours. Her husband notes knowing someone in [place] who is sending them tea's to try to help her diet. She takes Omega supplement, but not taking Calcium or Vit D.     REVIEW OF SYSTEMS:   Constitutional: Denies fevers, chills or abnormal weight loss Eyes: Denies blurriness of  vision Ears, nose, mouth, throat, and face: Denies mucositis or sore throat Respiratory: Denies cough, dyspnea or wheezes Cardiovascular: Denies palpitation, chest discomfort or lower extremity swelling Gastrointestinal:  Denies nausea, heartburn or change in bowel habits Skin: Denies abnormal skin rashes Lymphatics: Denies new lymphadenopathy or easy bruising Neurological:Denies numbness, tingling or new weaknesses Behavioral/Psych: Mood is stable, no new changes  All other systems were reviewed with the patient and are negative.  MEDICAL HISTORY:  Past Medical History:  Diagnosis Date  . Breast cancer ([place]) 12/2016   right  . History of radiation therapy 02/13/17- 03/20/17   Right Breast treated to 50 Gy in 25 fractions  . Hypertension    states under control with med., has been on med. ~ 15 yr.  . Personal history of radiation therapy 01/08/2017   Ended 02-01-2017    SURGICAL HISTORY: Past Surgical History:  Procedure Laterality Date  . BREAST LUMPECTOMY Right 01/02/2017  . BREAST LUMPECTOMY WITH RADIOACTIVE SEED AND SENTINEL LYMPH NODE BIOPSY Right 01/02/2017   Procedure: RIGHT BREAST LUMPECTOMY WITH RADIOACTIVE SEED AND RIGHT AXILLARY SENTINEL LYMPH NODE BIOPSY;  Surgeon: Crystal Overall, MD;  Location: [facility];  Service: General;  Laterality: Right;  . COLONOSCOPY  2018  .  RE-EXCISION OF BREAST LUMPECTOMY Right 01/18/2017   Procedure: RE-EXCISION OF BREAST LUMPECTOMY;  Surgeon: Crystal Overall, MD;  Location: [facility];  Service: General;  Laterality: Right;  . TONSILLECTOMY      I have reviewed the social history and family history with the patient and they are unchanged from previous note.  ALLERGIES:  is allergic to no known allergies.  MEDICATIONS:  Current Outpatient Medications  Medication Sig Dispense Refill  . anastrozole (ARIMIDEX) 1 MG tablet Take 1 tablet (1 mg total) by mouth daily. 30 tablet 5  . CANDESARTAN CILEXETIL PO Take 1 tablet by mouth. Pt not  sure of dose     No current facility-administered medications for this visit.    PHYSICAL EXAMINATION: ECOG PERFORMANCE STATUS: 1 - Symptomatic but completely ambulatory  Vitals:   04/06/20 0832  BP: (!) 146/80  Pulse: 73  Resp: 18  Temp: 97.7 F (36.5 C)  SpO2: 96%   Filed Weights   04/06/20 0832  Weight: (!) 232 lb 11.2 oz (105.6 kg)    GENERAL:alert, no distress and comfortable SKIN: skin color, texture, turgor are normal, no rashes or significant lesions EYES: normal, Conjunctiva are pink and non-injected, sclera clear  NECK: supple, thyroid normal size, non-tender, without nodularity LYMPH:  no palpable lymphadenopathy in the cervical, axillary  LUNGS: clear to auscultation and percussion with normal breathing effort HEART: regular rate & rhythm and no murmurs and no lower extremity edema ABDOMEN:abdomen soft, non-tender and normal bowel sounds Musculoskeletal:no cyanosis of digits and no clubbing  NEURO: alert & oriented x 3 with fluent speech, no focal motor/sensory deficits BREAST: s/p right lumpectomy: Surgical incision healed well with mild scar tissue, mild skin retraction and skin erythema. No palpable mass, nodules or adenopathy bilaterally. Breast exam benign.   LABORATORY DATA:  I have reviewed the data as listed CBC Latest Ref Rng & Units 04/06/2020 10/09/2019 04/08/2019  WBC 4.0 - 10.5 K/uL 5.6 6.0 5.3  Hemoglobin 12.0 - 15.0 g/dL 12.8 12.9 13.1  Hematocrit 36 - 46 % 39.9 39.6 40.2  Platelets 150 - 400 K/uL 270 256 260     CMP Latest Ref Rng & Units 04/06/2020 10/09/2019 04/08/2019  Glucose 70 - 99 mg/dL 147(H) 125(H) 153(H)  BUN 8 - 23 mg/dL 17 14 22   Creatinine 0.44 - 1.00 mg/dL 0.82 0.82 0.83  Sodium 135 - 145 mmol/L 139 138 138  Potassium 3.5 - 5.1 mmol/L 4.6 4.6 4.5  Chloride 98 - 111 mmol/L 103 104 104  CO2 22 - 32 mmol/L 25 25 24   Calcium 8.9 - 10.3 mg/dL 10.1 9.1 9.4  Total Protein 6.5 - 8.1 g/dL 7.3 7.5 7.5  Total Bilirubin 0.3 - 1.2 mg/dL 0.4  0.3 0.6  Alkaline Phos 38 - 126 U/L 87 87 94  AST 15 - 41 U/L 19 16 22   ALT 0 - 44 U/L 33 29 32    Colonoscopy by Dr Ardis Hughs 03/11/20  IMPRESSION - One 1 mm polyp in the ascending colon, removed with a cold biopsy forceps. Resected and retrieved. - Two 2 to 4 mm polyps in the transverse colon, removed with a cold snare. Resected and retrieved. - External and internal hemorrhoids. - The examination was otherwise normal on direct and retroflexion views. Diagnosis Surgical [P], colon, transverse and ascending, polyp (3) - TUBULAR ADENOMA(S) WITHOUT HIGH-GRADE DYSPLASIA OR MALIGNANCY - OTHER FRAGMENT OF POLYPOID COLONIC MUCOSA WITH NO SPECIFIC HISTOPATHOLOGIC CHANGES   RADIOGRAPHIC STUDIES: I have personally reviewed the radiological images as listed  and agreed with the findings in the report. No results found.   ASSESSMENT & PLAN:  Crystal Logan is a 69 y.o. female with    1. 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  -She started antiestrogen therapy with letrozole in 04/2017. Due to weight gain, anxiety and trouble sleeping, I switched her to Anastrozolein 05/2018.  -She tolerates Anastrozolebetterbut still continues to gain weight, have joint painand has manageable hot flashes.I reviewed diet change.she has gained about 50lbs sice her cancer diagnosis, mainly from antiestrogen therapy. Given her side effects, will likely continue for 5 years.  -From a breast cancer standpoint she is clinically doing well. Lab reviewed, her CBC and CMP are within normal limits except BG 147. Her physical exam and her 03/2020 mammogram were unremarkable with stable surgical changes to right breast. There is no clinical concern for recurrence. -She is over 3 years since her cancer diagnosis.  Continue breast cancer surveillance. Next mammogram in 03/2021 -Continue Anastrozole, plan for at least 5 years.  -F/u in 6 months    2. Tobacco use -She has been smoking since 69 years-old. She previously smoked 1 ppd  -She has stopped smokingpreviously.   3. HTN -She'll continue medication and follow-up with her primary care physician.  -She continues to have intermittently elevated BP. BP at 146/80 today (04/06/20). She still occasionally has Headaches.  -I recommend she continue to monitor her BP at home and discuss this with her PCP at next visit as she may need HTN medications for control.    4. Bone Health  -We discussed his risk of osteopenia and osteoporosis from aromatase inhibitor -Baseline DEXA from 03/2018 was normal.  -Her husband notes she is apprehensive of more imaging and they would like to postpone her DEXA scan to 2022.  -I recommend she take Vit D daily. She is agreeable to start   5. Obesity, weight gain,arthralgia -Her increased appetite, weight gain and joint pain is related to her Anastrozole -She should f/u with her PCP to evaluate her knee as this can be related to arthritis as well. -She has changed her diet and has been able to lose weight. I recommend she continue but eat adequately with healthy meal and less salt. I also encouraged her to exercise.  -Although her weight is stable in the past 6 months, she has increased appetite and eating more vegetables that lead to bloating. She is still trying to lose weight.  -I discussed option of referral to [facility], she declined for now. I encouraged her to increase intensity in exercise to help her lose weight along with healthy diet.  -I recommend she check her cholesterol and A1c with her PCP at next visit.    Plan: -Lab and f/u in6 months -Continue Anastrozole, refilled today  -pt is concerned about her weight gain, but declined referral to weight management  clinic, not interested in medical treatment for her obesity    No problem-specific Assessment & Plan notes found for this encounter.   No orders of the defined types were placed in this encounter.  All questions were answered. The patient knows to call the clinic with any problems, questions or concerns. No barriers to learning was detected. The total time spent in the appointment was 30 minutes.  Crystal Merle, MD 04/06/2020   I, Joslyn Devon, am acting as scribe for Crystal Merle, MD.   I have reviewed the above documentation for accuracy and completeness, and I agree with the above.

## 2020-04-06 ENCOUNTER — Encounter: Payer: Self-pay | Admitting: Hematology

## 2020-04-06 ENCOUNTER — Other Ambulatory Visit: Payer: Self-pay

## 2020-04-06 ENCOUNTER — Inpatient Hospital Stay: Payer: Medicare Other | Attending: Hematology | Admitting: Hematology

## 2020-04-06 ENCOUNTER — Inpatient Hospital Stay: Payer: Medicare Other

## 2020-04-06 ENCOUNTER — Telehealth: Payer: Self-pay | Admitting: Hematology

## 2020-04-06 VITALS — BP 146/80 | HR 73 | Temp 97.7°F | Resp 18 | Ht 64.0 in | Wt 232.7 lb

## 2020-04-06 DIAGNOSIS — R519 Headache, unspecified: Secondary | ICD-10-CM | POA: Insufficient documentation

## 2020-04-06 DIAGNOSIS — E669 Obesity, unspecified: Secondary | ICD-10-CM | POA: Diagnosis not present

## 2020-04-06 DIAGNOSIS — M2550 Pain in unspecified joint: Secondary | ICD-10-CM | POA: Insufficient documentation

## 2020-04-06 DIAGNOSIS — C773 Secondary and unspecified malignant neoplasm of axilla and upper limb lymph nodes: Secondary | ICD-10-CM | POA: Diagnosis not present

## 2020-04-06 DIAGNOSIS — C50211 Malignant neoplasm of upper-inner quadrant of right female breast: Secondary | ICD-10-CM | POA: Diagnosis not present

## 2020-04-06 DIAGNOSIS — I10 Essential (primary) hypertension: Secondary | ICD-10-CM | POA: Insufficient documentation

## 2020-04-06 DIAGNOSIS — N951 Menopausal and female climacteric states: Secondary | ICD-10-CM | POA: Diagnosis not present

## 2020-04-06 DIAGNOSIS — Z170 Estrogen receptor positive status [ER+]: Secondary | ICD-10-CM | POA: Diagnosis not present

## 2020-04-06 LAB — COMPREHENSIVE METABOLIC PANEL
ALT: 33 U/L (ref 0–44)
AST: 19 U/L (ref 15–41)
Albumin: 3.9 g/dL (ref 3.5–5.0)
Alkaline Phosphatase: 87 U/L (ref 38–126)
Anion gap: 11 (ref 5–15)
BUN: 17 mg/dL (ref 8–23)
CO2: 25 mmol/L (ref 22–32)
Calcium: 10.1 mg/dL (ref 8.9–10.3)
Chloride: 103 mmol/L (ref 98–111)
Creatinine, Ser: 0.82 mg/dL (ref 0.44–1.00)
GFR calc Af Amer: >60 mL/min (ref >60)
GFR calc non Af Amer: >60 mL/min (ref >60)
Glucose, Bld: 147 mg/dL — ABNORMAL HIGH (ref 70–99)
Potassium: 4.6 mmol/L (ref 3.5–5.1)
Sodium: 139 mmol/L (ref 135–145)
Total Bilirubin: 0.4 mg/dL (ref 0.3–1.2)
Total Protein: 7.3 g/dL (ref 6.5–8.1)

## 2020-04-06 LAB — CBC WITH DIFFERENTIAL/PLATELET
Abs Immature Granulocytes: 0.02 10*3/uL (ref 0.00–0.07)
Basophils Absolute: 0.1 10*3/uL (ref 0.0–0.1)
Basophils Relative: 1 %
Eosinophils Absolute: 0.2 10*3/uL (ref 0.0–0.5)
Eosinophils Relative: 4 %
HCT: 39.9 % (ref 36.0–46.0)
Hemoglobin: 12.8 g/dL (ref 12.0–15.0)
Immature Granulocytes: 0 %
Lymphocytes Relative: 26 %
Lymphs Abs: 1.4 10*3/uL (ref 0.7–4.0)
MCH: 29 pg (ref 26.0–34.0)
MCHC: 32.1 g/dL (ref 30.0–36.0)
MCV: 90.5 fL (ref 80.0–100.0)
Monocytes Absolute: 0.7 10*3/uL (ref 0.1–1.0)
Monocytes Relative: 12 %
Neutro Abs: 3.1 10*3/uL (ref 1.7–7.7)
Neutrophils Relative %: 57 %
Platelets: 270 10*3/uL (ref 150–400)
RBC: 4.41 MIL/uL (ref 3.87–5.11)
RDW: 13.1 % (ref 11.5–15.5)
WBC: 5.6 10*3/uL (ref 4.0–10.5)
nRBC: 0 % (ref 0.0–0.2)

## 2020-04-06 MED ORDER — ANASTROZOLE 1 MG PO TABS: 1.0000 mg | ORAL_TABLET | Freq: Every day | ORAL | 5 refills | Status: DC

## 2020-04-06 NOTE — Telephone Encounter (Signed)
Scheduled per 7/28 los. Printed avs and calendar for pt. 

## 2020-10-05 NOTE — Progress Notes (Signed)
[place]   [phone] [phone]   Clinic Follow up Note   Patient Care Team: Jilda Panda, MD as PCP - General (Internal Medicine) Alphonsa Overall, MD as Consulting Physician (General Surgery) Truitt Merle, MD as Consulting Physician (Hematology) Eppie Gibson, MD as Attending Physician (Radiation Oncology) Gardenia Phlegm, NP as Nurse Practitioner (Hematology and Oncology)  Date of Service:  10/07/2020  CHIEF COMPLAINT: Follow up right breast cancer  SUMMARY OF ONCOLOGIC HISTORY: Oncology History Overview Note  Cancer Staging Malignant neoplasm of upper-inner quadrant of right breast in female, estrogen receptor positive ([place]) Staging form: Breast, AJCC 8th Edition - Clinical stage from 11/27/2016: Stage IA (cT1c, cN0, cM0, G2, ER: Positive, PR: Positive, HER2: Negative) - Signed by Truitt Merle, MD on 12/05/2016     Malignant neoplasm of upper-inner quadrant of right breast in female, estrogen receptor positive ([place])  11/16/2016 Mammogram   Diagnostic mammogram and US showed suspicious asymmetry/distortion within the inner right breast, 3 o'clock axis region, at middle depth, without definite sonographic Correlated, axilla (-)   11/27/2016 Receptors her2   ER 90%+ PR 40%+ HER2 - Ki67 5%   11/27/2016 Initial Biopsy   Breast, right, needle core biopsy, 3:00 o'clock - INVASIVE LOBULAR CARCINOMA, G1-2   11/27/2016 Initial Diagnosis   Malignant neoplasm of upper-inner quadrant of right breast in female, estrogen receptor positive ([place])   12/10/2016 Imaging   MR Breast Bilateral IMPRESSION: 1. Post biopsy changes and associated non masslike enhancement in the medial aspect of the right breast. 2. Left breast is negative. 3. No MRI evidence for adenopathy.   12/26/2016 Procedure   Colonoscopy  - Three 3 to 6 mm polyps in the descending colon, removed with a cold snare. Resected and retrieved. - The examination was otherwise normal on  direct and retroflexion views.   12/26/2016 Pathology Results   Diagnosis Surgical [P], descending, polyps (3) -TUBULAR ADENOMAS. -NO HIGH GRADE DYSPLASIA OR MALIGNANCY IDENTIFIED.   01/02/2017 Surgery   Right Breast lumpectomy with sentinel lymph node biopsy performed by Dr. Lucia Gaskins.   01/02/2017 Pathology Results   Diagnosis  1. Breast, lumpectomy, Right - INVASIVE LOBULAR CARCINOMA, 2.8 CM. - INVASIVE CARCINOMA BROADLY INVOLVES INFERIOR MARGIN. - PREVIOUS BIOPSY SITE. - FOCAL LYMPH VASCULAR INVOLVEMENT BY TUMOR. 2. Lymph node, sentinel, biopsy, Right Axillary - METASTATIC CARCINOMA IN ONE LYMPH NODE WITH EXTRANODAL EXTENSION (1/1).   01/18/2017 Surgery   RE-EXCISION OF BREAST LUMPECTOMY by Dr. Lucia Gaskins    01/18/2017 Pathology Results   Diagnosis 01/18/17 Breast, excision, Right Inferior Margin - ATYPICAL DUCTAL HYPERPLASIA. - FIBROCYSTIC CHANGES WITH CALCIFICATIONS. - VASCULAR CALCIFICATIONS. - SEE COMMENT. Microscopic Comment The surgical resection margin(s) of the specimen were inked and microscopically evaluated     - 03/20/2017 Radiation Therapy   Radiation by Dr. Isidore Moos    04/2017 -  Anti-estrogen oral therapy   Letrozole daily started in 04/2017. Due to anxiety, weight gain and troule sleeping I swithced her to Anastrozole in 05/2018.    03/24/2018 Imaging   03/24/2018 Bone Density ASSESSMENT: The BMD measured at Femur Neck Left is 0.944 g/cm2 with a T-score of -0.7. This patient is considered normal according to [facility]) criteria.   03/24/2018 Mammogram   03/24/2018 Mammogram IMPRESSION: Expected surgical changes in the medial central right breast. No mammographic evidence of malignancy in the bilateral breasts.   03/24/2018 Imaging   DEXA ASSESSMENT: The BMD measured at Femur Neck Left is 0.944 g/cm2 with a T-score of -0.7. This  patient is considered normal according to [facility]) criteria.  The scan quality is good.  L-3, L-4 were excluded due to degenerative changes.  Site Region Measured Date Measured Age YA BMD Significant CHANGE T-score AP Spine  L1-L2     3/24/[age]         -0.5    1.110 g/cm2  DualFemur Neck Left 3/24/2018    67.2         -0.7    0.944 g/cm2  World Health Organization [facility]) criteria for post-menopausal, Caucasian Women: Normal       T-score at or above -1 SD Osteopenia   T-score between -1 and -2.5 SD Osteoporosis T-score at or below -2.5 SD  RECOMMENDATION: 1. All patients should optimize calcium and vitamin D intake. 2. Consider FDA approved medical therapies in postmenopausal women and men aged 53 years and older, based on the following: a. A hip or vertebral (clinical or morphometric) fracture b. T- score < or = -2.5 at the femoral neck or spine after appropriate evaluation to exclude secondary causes c. Low bone mass (T-score between -1.0 and -2.5 at the femoral neck or spine) and a 10 year probability of a hip fracture > or = 3% or a 10 year probability of a major osteoporosis-related fracture > or = 20% based on the [place]-adapted WHO algorithm d. Clinician judgment and/or patient preferences may indicate treatment for people with 10-year fracture probabilities above or below these levels      CURRENT THERAPY:  Letrozole once daily for 7 years starting 04/10/17. Changed to anastrozole in 05/2018  INTERVAL HISTORY:  Crystal Logan is here for a follow up of right breast cancer. She was last seen by me 6 months ago. She presents to the clinic with her husband who translates for her. She notes she has tried to stop eating to lose weight, but she is not able to be consisted. She tries to eat more vegetables like broccoli and carrot. I reviewed medication list with her. She is on one HTN medication and Anastrozole. She is tolerating Anastrozole well. She notes she also takes Omega 3 and B12. She notes she exercises 3 times a week with multiple different  exercises.    REVIEW OF SYSTEMS:   Constitutional: Denies fevers, chills or abnormal weight loss Eyes: Denies blurriness of vision Ears, nose, mouth, throat, and face: Denies mucositis or sore throat Respiratory: Denies cough, dyspnea or wheezes Cardiovascular: Denies palpitation, chest discomfort or lower extremity swelling Gastrointestinal:  Denies nausea, heartburn or change in bowel habits Skin: Denies abnormal skin rashes Lymphatics: Denies new lymphadenopathy or easy bruising Neurological:Denies numbness, tingling or new weaknesses Behavioral/Psych: Mood is stable, no new changes  All other systems were reviewed with the patient and are negative.  MEDICAL HISTORY:  Past Medical History:  Diagnosis Date  . Breast cancer ([place]) 12/2016   right  . History of radiation therapy 02/13/17- 03/20/17   Right Breast treated to 50 Gy in 25 fractions  . Hypertension    states under control with med., has been on med. ~ 15 yr.  . Personal history of radiation therapy 01/08/2017   Ended 02-01-2017    SURGICAL HISTORY: Past Surgical History:  Procedure Laterality Date  . BREAST LUMPECTOMY Right 01/02/2017  . BREAST LUMPECTOMY WITH RADIOACTIVE SEED AND SENTINEL LYMPH NODE BIOPSY Right 01/02/2017   Procedure: RIGHT BREAST LUMPECTOMY WITH RADIOACTIVE SEED AND RIGHT AXILLARY SENTINEL LYMPH NODE BIOPSY;  Surgeon: Alphonsa Overall,  MD;  Location: [place];  Service: General;  Laterality: Right;  . COLONOSCOPY  2018  . RE-EXCISION OF BREAST LUMPECTOMY Right 01/18/2017   Procedure: RE-EXCISION OF BREAST LUMPECTOMY;  Surgeon: Alphonsa Overall, MD;  Location: [place];  Service: General;  Laterality: Right;  . TONSILLECTOMY      I have reviewed the social history and family history with the patient and they are unchanged from previous note.  ALLERGIES:  is allergic to no known allergies.  MEDICATIONS:  Current Outpatient Medications  Medication Sig Dispense Refill  . anastrozole  (ARIMIDEX) 1 MG tablet Take 1 tablet (1 mg total) by mouth daily. 30 tablet 5  . CANDESARTAN CILEXETIL PO Take 1 tablet by mouth. Pt not sure of dose     No current facility-administered medications for this visit.    PHYSICAL EXAMINATION: ECOG PERFORMANCE STATUS: 0 - Asymptomatic  Vitals:   10/07/20 0852  BP: (!) 151/72  Pulse: 75  Resp: 18  Temp: 97.6 F (36.4 C)  SpO2: 98%   Filed Weights   10/07/20 0852  Weight: 231 lb (104.8 kg)    GENERAL:alert, no distress and comfortable SKIN: skin color, texture, turgor are normal, no rashes or significant lesions EYES: normal, Conjunctiva are pink and non-injected, sclera clear  NECK: supple, thyroid normal size, non-tender, without nodularity LYMPH:  no palpable lymphadenopathy in the cervical, axillary  LUNGS: clear to auscultation and percussion with normal breathing effort HEART: regular rate & rhythm and no murmurs and no lower extremity edema ABDOMEN:abdomen soft, non-tender and normal bowel sounds. No hepatomegaly  Musculoskeletal:no cyanosis of digits and no clubbing  NEURO: alert & oriented x 3 with fluent speech, no focal motor/sensory deficits BREAST: S/p right lumpectomy: Surgical incision healed well with mild deformation from scar tissue (+) Right breast skin mildly pink without warmness or tenderness. Left Breast exam benign.   LABORATORY DATA:  I have reviewed the data as listed CBC Latest Ref Rng & Units 10/07/2020 04/06/2020 10/09/2019  WBC 4.0 - 10.5 K/uL 4.9 5.6 6.0  Hemoglobin 12.0 - 15.0 g/dL 13.1 12.8 12.9  Hematocrit 36.0 - 46.0 % 41.1 39.9 39.6  Platelets 150 - 400 K/uL 259 270 256     CMP Latest Ref Rng & Units 10/07/2020 04/06/2020 10/09/2019  Glucose 70 - 99 mg/dL 140(H) 147(H) 125(H)  BUN 8 - 23 mg/dL 12 17 14   Creatinine 0.44 - 1.00 mg/dL 0.80 0.82 0.82  Sodium 135 - 145 mmol/L 137 139 138  Potassium 3.5 - 5.1 mmol/L 4.4 4.6 4.6  Chloride 98 - 111 mmol/L 103 103 104  CO2 22 - 32 mmol/L 27 25 25    Calcium 8.9 - 10.3 mg/dL 9.3 10.1 9.1  Total Protein 6.5 - 8.1 g/dL 7.5 7.3 7.5  Total Bilirubin 0.3 - 1.2 mg/dL 0.4 0.4 0.3  Alkaline Phos 38 - 126 U/L 80 87 87  AST 15 - 41 U/L 20 19 16   ALT 0 - 44 U/L 38 33 29      RADIOGRAPHIC STUDIES: I have personally reviewed the radiological images as listed and agreed with the findings in the report. No results found.   ASSESSMENT & PLAN:  Crystal Logan is a 70 y.o. female with   1. 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  -She started  antiestrogen therapy with letrozole in 04/2017. Due to weight gain, anxiety and trouble sleeping, I switched her to Anastrozolein 05/2018.  -She tolerates Anastrozolebetterbut still continues to gain weight, have joint painand has manageable hot flashes.Given her side effects, will likelycontinue for 5 years. -From a abreast cancer standpoint, she is clinically doing well. Lab reviewed, her CBC and CMP are within normal limits. Her physical exam and her 03/2020 mammogram were unremarkable. There is no clinical concern for recurrence. -Continue surveillance. Next mammogram in 03/2021.  -Continue Anastrozole.  -F/u in 6 months   2. Tobacco use -She has been smoking since 70 years-old. She previously smoked 1 ppd  -She has stopped smokingpreviously.   3. HTN, Health Management  -She'll continue medication and follow-up with her primary care physician. -She continues to have intermittently elevated BP. She is on one high dose HTN medication. I recommend she f/u with PCP as she may need more medication to better control her HTN  -Her 03/2020 colonoscopy showed benign polyps were removed.   4. Bone Health  -We discussed his risk of osteopenia and osteoporosis from aromatase  inhibitor -Baseline DEXA from 03/2018 was normal. She has declined more DEXA scans.  -I recommend she take Vit D and Calcium daily. She is agreeable.   5. Obesity, weight gain,arthralgia -Her increased appetite, weight gain and joint pain is related to her Anastrozole -She should f/u with her PCP to evaluate her knee as this can be related to arthritis as well. -Her weight is stable overall. She has tried to alter diet some but weight loss is limited. I recommend again low carb diet and no red meat. I encouraged her to exercise more.  -She previously declined referral to [facility].  -I recommend she monitor her cholesterol and A1c with her PCP   Plan: -Mammogram in 03/2021  -Continue Anastrozole, refilled today  -Lab and F/u in 6 months    No problem-specific Assessment & Plan notes found for this encounter.   Orders Placed This Encounter  Procedures  . MM DIAG BREAST TOMO BILATERAL    Standing Status:   Future    Standing Expiration Date:   10/07/2021    Order Specific Question:   Reason for Exam (SYMPTOM  OR DIAGNOSIS REQUIRED)    Answer:   SCREENING    Order Specific Question:   Preferred imaging location?    Answer:   [facility]   All questions were answered. The patient knows to call the clinic with any problems, questions or concerns. No barriers to learning was detected. The total time spent in the appointment was 30 minutes.     Truitt Merle, MD 10/07/2020   I, Joslyn Devon, am acting as scribe for Truitt Merle, MD.   I have reviewed the above documentation for accuracy and completeness, and I agree with the above.

## 2020-10-07 ENCOUNTER — Encounter: Payer: Self-pay | Admitting: Hematology

## 2020-10-07 ENCOUNTER — Inpatient Hospital Stay: Payer: Medicare Other | Attending: Hematology | Admitting: Hematology

## 2020-10-07 ENCOUNTER — Inpatient Hospital Stay: Payer: Medicare Other

## 2020-10-07 ENCOUNTER — Other Ambulatory Visit: Payer: Self-pay

## 2020-10-07 VITALS — BP 151/72 | HR 75 | Temp 97.6°F | Resp 18 | Ht 64.0 in | Wt 231.0 lb

## 2020-10-07 DIAGNOSIS — Z79811 Long term (current) use of aromatase inhibitors: Secondary | ICD-10-CM | POA: Diagnosis not present

## 2020-10-07 DIAGNOSIS — I10 Essential (primary) hypertension: Secondary | ICD-10-CM | POA: Insufficient documentation

## 2020-10-07 DIAGNOSIS — Z170 Estrogen receptor positive status [ER+]: Secondary | ICD-10-CM | POA: Diagnosis not present

## 2020-10-07 DIAGNOSIS — C50211 Malignant neoplasm of upper-inner quadrant of right female breast: Secondary | ICD-10-CM | POA: Diagnosis present

## 2020-10-07 DIAGNOSIS — C773 Secondary and unspecified malignant neoplasm of axilla and upper limb lymph nodes: Secondary | ICD-10-CM | POA: Insufficient documentation

## 2020-10-07 DIAGNOSIS — Z87891 Personal history of nicotine dependence: Secondary | ICD-10-CM | POA: Diagnosis not present

## 2020-10-07 DIAGNOSIS — E669 Obesity, unspecified: Secondary | ICD-10-CM | POA: Insufficient documentation

## 2020-10-07 LAB — CBC WITH DIFFERENTIAL/PLATELET
Abs Immature Granulocytes: 0 10*3/uL (ref 0.00–0.07)
Basophils Absolute: 0.1 10*3/uL (ref 0.0–0.1)
Basophils Relative: 1 %
Eosinophils Absolute: 0.1 10*3/uL (ref 0.0–0.5)
Eosinophils Relative: 2 %
HCT: 41.1 % (ref 36.0–46.0)
Hemoglobin: 13.1 g/dL (ref 12.0–15.0)
Immature Granulocytes: 0 %
Lymphocytes Relative: 30 %
Lymphs Abs: 1.5 10*3/uL (ref 0.7–4.0)
MCH: 29.4 pg (ref 26.0–34.0)
MCHC: 31.9 g/dL (ref 30.0–36.0)
MCV: 92.2 fL (ref 80.0–100.0)
Monocytes Absolute: 0.7 10*3/uL (ref 0.1–1.0)
Monocytes Relative: 14 %
Neutro Abs: 2.5 10*3/uL (ref 1.7–7.7)
Neutrophils Relative %: 53 %
Platelets: 259 10*3/uL (ref 150–400)
RBC: 4.46 MIL/uL (ref 3.87–5.11)
RDW: 13 % (ref 11.5–15.5)
WBC: 4.9 10*3/uL (ref 4.0–10.5)
nRBC: 0 % (ref 0.0–0.2)

## 2020-10-07 LAB — COMPREHENSIVE METABOLIC PANEL
ALT: 38 U/L (ref 0–44)
AST: 20 U/L (ref 15–41)
Albumin: 4 g/dL (ref 3.5–5.0)
Alkaline Phosphatase: 80 U/L (ref 38–126)
Anion gap: 7 (ref 5–15)
BUN: 12 mg/dL (ref 8–23)
CO2: 27 mmol/L (ref 22–32)
Calcium: 9.3 mg/dL (ref 8.9–10.3)
Chloride: 103 mmol/L (ref 98–111)
Creatinine, Ser: 0.8 mg/dL (ref 0.44–1.00)
GFR, Estimated: >60 mL/min (ref >60)
Glucose, Bld: 140 mg/dL — ABNORMAL HIGH (ref 70–99)
Potassium: 4.4 mmol/L (ref 3.5–5.1)
Sodium: 137 mmol/L (ref 135–145)
Total Bilirubin: 0.4 mg/dL (ref 0.3–1.2)
Total Protein: 7.5 g/dL (ref 6.5–8.1)

## 2020-10-07 MED ORDER — ANASTROZOLE 1 MG PO TABS: 1.0000 mg | ORAL_TABLET | Freq: Every day | ORAL | 5 refills | Status: DC

## 2021-03-29 ENCOUNTER — Ambulatory Visit
Admission: RE | Admit: 2021-03-29 | Discharge: 2021-03-29 | Disposition: A | Discharge status: Home / Self Care | Payer: Medicare Other | Source: Ambulatory Visit | Attending: Hematology | Admitting: Hematology

## 2021-03-29 ENCOUNTER — Other Ambulatory Visit: Payer: Self-pay

## 2021-03-29 DIAGNOSIS — C50211 Malignant neoplasm of upper-inner quadrant of right female breast: Secondary | ICD-10-CM

## 2021-04-05 NOTE — Progress Notes (Signed)
Crystal Logan   [phone] [phone]   Clinic Follow up Note   Patient Care Team: Jilda Panda, MD as PCP - General (Internal Medicine) Alphonsa Overall, MD as Consulting Physician (General Surgery) Truitt Merle, MD as Consulting Physician (Hematology) Eppie Gibson, MD as Attending Physician (Radiation Oncology) Gardenia Phlegm, NP as Nurse Practitioner (Hematology and Oncology)  Date of Service:  04/06/2021  CHIEF COMPLAINT: Follow up right breast cancer  SUMMARY OF ONCOLOGIC HISTORY: Oncology History Overview Note  Cancer Staging Malignant neoplasm of upper-inner quadrant of right breast in female, estrogen receptor positive ([place]) Staging form: Breast, AJCC 8th Edition - Clinical stage from 11/27/2016: Stage IA (cT1c, cN0, cM0, G2, ER: Positive, PR: Positive, HER2: Negative) - Signed by Truitt Merle, MD on 12/05/2016     Malignant neoplasm of upper-inner quadrant of right breast in female, estrogen receptor positive ([place])  11/16/2016 Mammogram   Diagnostic mammogram and US showed suspicious asymmetry/distortion within the inner right breast, 3 o'clock axis region, at middle depth, without definite sonographic Correlated, axilla (-)   11/27/2016 Receptors her2   ER 90%+ PR 40%+ HER2 - Ki67 5%    11/27/2016 Initial Biopsy   Breast, right, needle core biopsy, 3:00 o'clock - INVASIVE LOBULAR CARCINOMA, G1-2    11/27/2016 Initial Diagnosis   Malignant neoplasm of upper-inner quadrant of right breast in female, estrogen receptor positive ([place])    12/10/2016 Imaging   MR Breast Bilateral IMPRESSION: 1. Post biopsy changes and associated non masslike enhancement in the medial aspect of the right breast. 2. Left breast is negative. 3. No MRI evidence for adenopathy.    12/26/2016 Procedure   Colonoscopy  - Three 3 to 6 mm polyps in the descending colon, removed with a cold snare. Resected and retrieved. - The examination was otherwise  normal on direct and retroflexion views.    12/26/2016 Pathology Results   Diagnosis Surgical [P], descending, polyps (3) -TUBULAR ADENOMAS. -NO HIGH GRADE DYSPLASIA OR MALIGNANCY IDENTIFIED.    01/02/2017 Surgery   Right Breast lumpectomy with sentinel lymph node biopsy performed by Dr. Lucia Gaskins.    01/02/2017 Pathology Results   Diagnosis  1. Breast, lumpectomy, Right - INVASIVE LOBULAR CARCINOMA, 2.8 CM. - INVASIVE CARCINOMA BROADLY INVOLVES INFERIOR MARGIN. - PREVIOUS BIOPSY SITE. - FOCAL LYMPH VASCULAR INVOLVEMENT BY TUMOR. 2. Lymph node, sentinel, biopsy, Right Axillary - METASTATIC CARCINOMA IN ONE LYMPH NODE WITH EXTRANODAL EXTENSION (1/1).    01/18/2017 Surgery   RE-EXCISION OF BREAST LUMPECTOMY by Dr. Lucia Gaskins     01/18/2017 Pathology Results   Diagnosis 01/18/17 Breast, excision, Right Inferior Margin - ATYPICAL DUCTAL HYPERPLASIA. - FIBROCYSTIC CHANGES WITH CALCIFICATIONS. - VASCULAR CALCIFICATIONS. - SEE COMMENT. Microscopic Comment The surgical resection margin(s) of the specimen were inked and microscopically evaluated      - 03/20/2017 Radiation Therapy   Radiation by Dr. Isidore Moos     04/2017 -  Anti-estrogen oral therapy   Letrozole daily started in 04/2017. Due to anxiety, weight gain and troule sleeping I swithced her to Anastrozole in 05/2018.     03/24/2018 Imaging   03/24/2018 Bone Density ASSESSMENT: The BMD measured at Femur Neck Left is 0.944 g/cm2 with a T-score of -0.7. This patient is considered normal according to [facility]) criteria.    03/24/2018 Mammogram   03/24/2018 Mammogram IMPRESSION: Expected surgical changes in the medial central right breast. No mammographic evidence of malignancy in the bilateral breasts.    03/24/2018 Imaging   DEXA ASSESSMENT: The BMD  measured at Femur Neck Left is 0.944 g/cm2 with a T-score of -0.7. This patient is considered normal according to [facility]) criteria.    The scan quality is good. L-3, L-4 were excluded due to degenerative changes.   Site Region Measured Date Measured Age YA BMD Significant CHANGE T-score AP Spine  L1-L2     3/24/[age]         -0.5    1.110 g/cm2   DualFemur Neck Left 3/24/2018    67.2         -0.7    0.944 g/cm2   World Health Organization [facility]) criteria for post-menopausal, Caucasian Women: Normal       T-score at or above -1 SD Osteopenia   T-score between -1 and -2.5 SD Osteoporosis T-score at or below -2.5 SD   RECOMMENDATION: 1. All patients should optimize calcium and vitamin D intake. 2. Consider FDA approved medical therapies in postmenopausal women and men aged 64 years and older, based on the following: a. A hip or vertebral (clinical or morphometric) fracture b. T- score < or = -2.5 at the femoral neck or spine after appropriate evaluation to exclude secondary causes c. Low bone mass (T-score between -1.0 and -2.5 at the femoral neck or spine) and a 10 year probability of a hip fracture > or = 3% or a 10 year probability of a major osteoporosis-related fracture > or = 20% based on the [place]-adapted WHO algorithm d. Clinician judgment and/or patient preferences may indicate treatment for people with 10-year fracture probabilities above or below these levels       CURRENT THERAPY:  Letrozole once daily for 7 years starting 04/10/17. Changed to anastrozole in 05/2018  INTERVAL HISTORY:  Crystal Logan is here for a follow up of right breast cancer. She was last seen by me 6 months ago. She presents to the clinic with her husband who translates for her. She notes she has tried to stop eating to lose weight, but she is not able to be consisted. She tries to eat more vegetables like broccoli and carrot. I reviewed medication list with her. She is on one HTN medication and Anastrozole. She is tolerating Anastrozole well. She notes she also takes Omega 3 and B12. She notes she exercises 3 times a  week with multiple different exercises.    REVIEW OF SYSTEMS:   Constitutional: Denies fevers, chills or abnormal weight loss Eyes: Denies blurriness of vision Ears, nose, mouth, throat, and face: Denies mucositis or sore throat Respiratory: Denies cough, dyspnea or wheezes Cardiovascular: Denies palpitation, chest discomfort or lower extremity swelling Gastrointestinal:  Denies nausea, heartburn or change in bowel habits Skin: Denies abnormal skin rashes Lymphatics: Denies new lymphadenopathy or easy bruising Neurological:Denies numbness, tingling or new weaknesses Behavioral/Psych: Mood is stable, no new changes  All other systems were reviewed with the patient and are negative.  MEDICAL HISTORY:  Past Medical History:  Diagnosis Date   Breast cancer ([place]) 12/2016   right   History of radiation therapy 02/13/17- 03/20/17   Right Breast treated to 50 Gy in 25 fractions   Hypertension    states under control with med., has been on med. ~ 15 yr.   Personal history of radiation therapy 01/08/2017   Ended 02-01-2017    SURGICAL HISTORY: Past Surgical History:  Procedure Laterality Date   BREAST LUMPECTOMY Right 01/02/2017   BREAST LUMPECTOMY WITH RADIOACTIVE SEED AND SENTINEL LYMPH NODE BIOPSY Right 01/02/2017  Procedure: RIGHT BREAST LUMPECTOMY WITH RADIOACTIVE SEED AND RIGHT AXILLARY SENTINEL LYMPH NODE BIOPSY;  Surgeon: Alphonsa Overall, MD;  Location: [place];  Service: General;  Laterality: Right;   COLONOSCOPY  2018   RE-EXCISION OF BREAST LUMPECTOMY Right 01/18/2017   Procedure: RE-EXCISION OF BREAST LUMPECTOMY;  Surgeon: Alphonsa Overall, MD;  Location: [place];  Service: General;  Laterality: Right;   TONSILLECTOMY      I have reviewed the social history and family history with the patient and they are unchanged from previous note.  ALLERGIES:  is allergic to no known allergies.  MEDICATIONS:  Current Outpatient Medications  Medication Sig Dispense Refill    anastrozole (ARIMIDEX) 1 MG tablet Take 1 tablet (1 mg total) by mouth daily. 30 tablet 5   CANDESARTAN CILEXETIL PO Take 1 tablet by mouth. Pt not sure of dose     No current facility-administered medications for this visit.    PHYSICAL EXAMINATION: ECOG PERFORMANCE STATUS: 0 - Asymptomatic  Vitals:   04/06/21 0859  BP: (!) 144/76  Pulse: 75  Resp: 20  Temp: 98.2 F (36.8 C)  SpO2: 98%    Filed Weights   04/06/21 0859  Weight: 231 lb 8 oz (105 kg)     GENERAL:alert, no distress and comfortable SKIN: skin color, texture, turgor are normal, no rashes or significant lesions EYES: normal, Conjunctiva are pink and non-injected, sclera clear  NECK: supple, thyroid normal size, non-tender, without nodularity LYMPH:  no palpable lymphadenopathy in the cervical, axillary  LUNGS: clear to auscultation and percussion with normal breathing effort HEART: regular rate & rhythm and no murmurs and no lower extremity edema ABDOMEN:abdomen soft, non-tender and normal bowel sounds. No hepatomegaly  Musculoskeletal:no cyanosis of digits and no clubbing  NEURO: alert & oriented x 3 with fluent speech, no focal motor/sensory deficits BREAST: S/p right lumpectomy: Surgical incision healed well with mild deformation from scar tissue (+) Right breast skin mildly pink without warmness or tenderness. Left Breast exam benign. No axillary adenopathy   LABORATORY DATA:  I have reviewed the data as listed CBC Latest Ref Rng & Units 04/06/2021 10/07/2020 04/06/2020  WBC 4.0 - 10.5 K/uL 4.6 4.9 5.6  Hemoglobin 12.0 - 15.0 g/dL 13.0 13.1 12.8  Hematocrit 36.0 - 46.0 % 38.9 41.1 39.9  Platelets 150 - 400 K/uL 252 259 270     CMP Latest Ref Rng & Units 04/06/2021 10/07/2020 04/06/2020  Glucose 70 - 99 mg/dL 147(H) 140(H) 147(H)  BUN 8 - 23 mg/dL _0 Creatinine 0.44 - 1.00 mg/dL 0.97 0.80 0.82  Sodium 135 - 145 mmol/L 137 137 139  Potassium 3.5 - 5.1 mmol/L 4.8 4.4 4.6  Chloride 98 - 111 mmol/L 103  103 103  CO2 22 - 32 mmol/L _1 Calcium 8.9 - 10.3 mg/dL 9.8 9.3 10.1  Total Protein 6.5 - 8.1 g/dL 7.7 7.5 7.3  Total Bilirubin 0.3 - 1.2 mg/dL 0.4 0.4 0.4  Alkaline Phos 38 - 126 U/L 80 80 87  AST 15 - 41 U/L _2 ALT 0 - 44 U/L 37 38 33      RADIOGRAPHIC STUDIES: I have personally reviewed the radiological images as listed and agreed with the findings in the report. No results found.   ASSESSMENT & PLAN:  Crystal Logan is a 70 y.o. female with   1. 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  -She started antiestrogen therapy with letrozole in 04/2017. Due to weight gain, anxiety and trouble sleeping, I switched her to Anastrozole in 05/2018.  -She tolerates Anastrozole better but still continues to gain weight, have joint pain and has manageable hot flashes. Plan for at least 5 years, likely 7 years if she is able to tolerate. -She is clinically doing well, denies any pain or other concerning signs, tolerating anastrozole well overall.  No clinical concern for recurrence -Her recent mammogram was negative -Lab and follow-up in 6 months   2. Tobacco use -She has been smoking since 70 years-old. She previously smoked 1 ppd  -She has stopped smoking previously.    3. HTN, Health Management  -She'll continue medication and follow-up with her primary care physician.  -She continues to have intermittently elevated BP. She is on one high dose HTN medication. I recommend she f/u with PCP as she may need more medication to better control her HTN  -Her 03/2020 colonoscopy showed benign polyps were removed.    4. Bone Health  -We discussed his risk of osteopenia and osteoporosis from aromatase inhibitor -Baseline DEXA from 03/2018 was normal. She has repeated declined more  DEXA scans.  -I recommend she take Vit D and Calcium daily. She is agreeable.    5. Obesity, weight gain, arthralgia  -Her increased appetite, weight gain and joint pain is related to her Anastrozole -She should f/u with her PCP to evaluate her knee as this can be related to arthritis as well.  -Her weight is stable overall. She has tried to alter diet some but weight loss is limited. I recommend again low carb diet and no red meat. I encouraged her to exercise more.  -She again declined referral to [facility].  -I recommend she monitor her cholesterol and A1c with her PCP     Plan:  -Continue Anastrozole, refilled today  -Lab and F/u in 6 months    No problem-specific Assessment & Plan notes found for this encounter.   No orders of the defined types were placed in this encounter.  All questions were answered. The patient knows to call the clinic with any problems, questions or concerns. No barriers to learning was detected. The total time spent in the appointment was 30 minutes.     Truitt Merle, MD 04/06/2021   I, Wilburn Mylar, am acting as scribe for Truitt Merle, MD.   I have reviewed the above documentation for accuracy and completeness, and I agree with the above.

## 2021-04-06 ENCOUNTER — Other Ambulatory Visit: Payer: Self-pay

## 2021-04-06 ENCOUNTER — Encounter: Payer: Self-pay | Admitting: Hematology

## 2021-04-06 ENCOUNTER — Inpatient Hospital Stay: Payer: Medicare Other

## 2021-04-06 ENCOUNTER — Inpatient Hospital Stay: Payer: Medicare Other | Attending: Hematology | Admitting: Hematology

## 2021-04-06 VITALS — BP 144/76 | HR 75 | Temp 98.2°F | Resp 20 | Ht 64.0 in | Wt 231.5 lb

## 2021-04-06 DIAGNOSIS — Z170 Estrogen receptor positive status [ER+]: Secondary | ICD-10-CM

## 2021-04-06 DIAGNOSIS — C50211 Malignant neoplasm of upper-inner quadrant of right female breast: Secondary | ICD-10-CM | POA: Insufficient documentation

## 2021-04-06 DIAGNOSIS — Z87891 Personal history of nicotine dependence: Secondary | ICD-10-CM | POA: Diagnosis not present

## 2021-04-06 DIAGNOSIS — I10 Essential (primary) hypertension: Secondary | ICD-10-CM | POA: Insufficient documentation

## 2021-04-06 DIAGNOSIS — R635 Abnormal weight gain: Secondary | ICD-10-CM | POA: Insufficient documentation

## 2021-04-06 LAB — CBC WITH DIFFERENTIAL/PLATELET
Abs Immature Granulocytes: 0.01 10*3/uL (ref 0.00–0.07)
Basophils Absolute: 0.1 10*3/uL (ref 0.0–0.1)
Basophils Relative: 1 %
Eosinophils Absolute: 0.1 10*3/uL (ref 0.0–0.5)
Eosinophils Relative: 3 %
HCT: 38.9 % (ref 36.0–46.0)
Hemoglobin: 13 g/dL (ref 12.0–15.0)
Immature Granulocytes: 0 %
Lymphocytes Relative: 31 %
Lymphs Abs: 1.4 10*3/uL (ref 0.7–4.0)
MCH: 30.5 pg (ref 26.0–34.0)
MCHC: 33.4 g/dL (ref 30.0–36.0)
MCV: 91.3 fL (ref 80.0–100.0)
Monocytes Absolute: 0.7 10*3/uL (ref 0.1–1.0)
Monocytes Relative: 15 %
Neutro Abs: 2.3 10*3/uL (ref 1.7–7.7)
Neutrophils Relative %: 50 %
Platelets: 252 10*3/uL (ref 150–400)
RBC: 4.26 MIL/uL (ref 3.87–5.11)
RDW: 13 % (ref 11.5–15.5)
WBC: 4.6 10*3/uL (ref 4.0–10.5)
nRBC: 0 % (ref 0.0–0.2)

## 2021-04-06 LAB — COMPREHENSIVE METABOLIC PANEL
ALT: 37 U/L (ref 0–44)
AST: 22 U/L (ref 15–41)
Albumin: 4.1 g/dL (ref 3.5–5.0)
Alkaline Phosphatase: 80 U/L (ref 38–126)
Anion gap: 10 (ref 5–15)
BUN: 19 mg/dL (ref 8–23)
CO2: 24 mmol/L (ref 22–32)
Calcium: 9.8 mg/dL (ref 8.9–10.3)
Chloride: 103 mmol/L (ref 98–111)
Creatinine, Ser: 0.97 mg/dL (ref 0.44–1.00)
GFR, Estimated: >60 mL/min (ref >60)
Glucose, Bld: 147 mg/dL — ABNORMAL HIGH (ref 70–99)
Potassium: 4.8 mmol/L (ref 3.5–5.1)
Sodium: 137 mmol/L (ref 135–145)
Total Bilirubin: 0.4 mg/dL (ref 0.3–1.2)
Total Protein: 7.7 g/dL (ref 6.5–8.1)

## 2021-04-06 MED ORDER — ANASTROZOLE 1 MG PO TABS: 1.0000 mg | ORAL_TABLET | Freq: Every day | ORAL | 5 refills | Status: DC

## 2021-10-05 ENCOUNTER — Inpatient Hospital Stay (HOSPITAL_BASED_OUTPATIENT_CLINIC_OR_DEPARTMENT_OTHER): Payer: Medicare Other | Admitting: Hematology

## 2021-10-05 ENCOUNTER — Inpatient Hospital Stay: Payer: Medicare Other | Attending: Hematology

## 2021-10-05 ENCOUNTER — Encounter: Payer: Self-pay | Admitting: Hematology

## 2021-10-05 ENCOUNTER — Other Ambulatory Visit: Payer: Self-pay

## 2021-10-05 VITALS — BP 137/79 | HR 87 | Temp 98.4°F | Resp 18 | Ht 64.0 in | Wt 233.9 lb

## 2021-10-05 DIAGNOSIS — Z720 Tobacco use: Secondary | ICD-10-CM | POA: Diagnosis not present

## 2021-10-05 DIAGNOSIS — C773 Secondary and unspecified malignant neoplasm of axilla and upper limb lymph nodes: Secondary | ICD-10-CM | POA: Diagnosis not present

## 2021-10-05 DIAGNOSIS — C50211 Malignant neoplasm of upper-inner quadrant of right female breast: Secondary | ICD-10-CM

## 2021-10-05 DIAGNOSIS — E669 Obesity, unspecified: Secondary | ICD-10-CM | POA: Insufficient documentation

## 2021-10-05 DIAGNOSIS — Z170 Estrogen receptor positive status [ER+]: Secondary | ICD-10-CM

## 2021-10-05 DIAGNOSIS — I10 Essential (primary) hypertension: Secondary | ICD-10-CM | POA: Insufficient documentation

## 2021-10-05 LAB — CBC WITH DIFFERENTIAL/PLATELET
Abs Immature Granulocytes: 0.02 10*3/uL (ref 0.00–0.07)
Basophils Absolute: 0.1 10*3/uL (ref 0.0–0.1)
Basophils Relative: 1 %
Eosinophils Absolute: 0.2 10*3/uL (ref 0.0–0.5)
Eosinophils Relative: 3 %
HCT: 38 % (ref 36.0–46.0)
Hemoglobin: 12.6 g/dL (ref 12.0–15.0)
Immature Granulocytes: 0 %
Lymphocytes Relative: 29 %
Lymphs Abs: 1.5 10*3/uL (ref 0.7–4.0)
MCH: 30 pg (ref 26.0–34.0)
MCHC: 33.2 g/dL (ref 30.0–36.0)
MCV: 90.5 fL (ref 80.0–100.0)
Monocytes Absolute: 0.7 10*3/uL (ref 0.1–1.0)
Monocytes Relative: 14 %
Neutro Abs: 2.8 10*3/uL (ref 1.7–7.7)
Neutrophils Relative %: 53 %
Platelets: 283 10*3/uL (ref 150–400)
RBC: 4.2 MIL/uL (ref 3.87–5.11)
RDW: 12.9 % (ref 11.5–15.5)
WBC: 5.3 10*3/uL (ref 4.0–10.5)
nRBC: 0 % (ref 0.0–0.2)

## 2021-10-05 LAB — COMPREHENSIVE METABOLIC PANEL
ALT: 34 U/L (ref 0–44)
AST: 21 U/L (ref 15–41)
Albumin: 4.4 g/dL (ref 3.5–5.0)
Alkaline Phosphatase: 65 U/L (ref 38–126)
Anion gap: 9 (ref 5–15)
BUN: 24 mg/dL — ABNORMAL HIGH (ref 8–23)
CO2: 26 mmol/L (ref 22–32)
Calcium: 9.4 mg/dL (ref 8.9–10.3)
Chloride: 99 mmol/L (ref 98–111)
Creatinine, Ser: 0.88 mg/dL (ref 0.44–1.00)
GFR, Estimated: >60 mL/min (ref >60)
Glucose, Bld: 156 mg/dL — ABNORMAL HIGH (ref 70–99)
Potassium: 4.3 mmol/L (ref 3.5–5.1)
Sodium: 134 mmol/L — ABNORMAL LOW (ref 135–145)
Total Bilirubin: 0.4 mg/dL (ref 0.3–1.2)
Total Protein: 7.4 g/dL (ref 6.5–8.1)

## 2021-10-05 MED ORDER — ANASTROZOLE 1 MG PO TABS: 1.0000 mg | ORAL_TABLET | Freq: Every day | ORAL | 5 refills | Status: DC

## 2021-10-05 NOTE — Progress Notes (Addendum)
[place]   Telephone:(336) [phone] Fax:([phone]   Clinic Follow up Note   Patient Care Team: Jilda Panda, MD as PCP - General (Internal Medicine) Alphonsa Overall, MD as Consulting Physician (General Surgery) Truitt Merle, MD as Consulting Physician (Hematology) Eppie Gibson, MD as Attending Physician (Radiation Oncology) Gardenia Phlegm, NP as Nurse Practitioner (Hematology and Oncology)  Date of Service:  10/05/2021  CHIEF COMPLAINT: f/u of right breast cancer  CURRENT THERAPY:  Letrozole once daily for 7 years starting 04/10/17. Changed to anastrozole in 05/2018  ASSESSMENT & PLAN:  Crystal Logan is a 71 y.o. female with   1. 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  -She started antiestrogen therapy with letrozole in 04/2017. Due to weight gain, anxiety and trouble sleeping, I switched her to Anastrozole in 05/2018.  -She tolerates Anastrozole better but she is concerned about her weight. While she did gain weight about 25lbs on AI, her weight has been overall stable for the last few years. Plan for at least 5 years, likely 7 years if she is able to tolerate. She would prefer to take for only 5 because of her weight concerns and difficulty losing weight. We will discuss again in 6 months. -most recent mammogram on 03/29/21 was benign. -She is clinically doing well, denies any pain or other concerning signs, tolerating anastrozole well overall. Physical exam showed some mild right breast erythema, which she explains is from rubbing lotion on it. No clinical concern for recurrence. Lab results reviewed with her today  -Lab and follow-up in 6 months   2. Tobacco use -She started smoking at 71 years-old. She previously smoked 1 ppd  -She has stopped  smoking previously.    3. HTN, Health Management  -She'll continue medication and follow-up with her PCP. -She continues to have intermittently elevated BP. She is on one high dose HTN medication. I recommend she f/u with PCP as she may need more medication to better control her HTN  -Her 03/2020 colonoscopy showed benign polyps (removed).    4. Bone Health  -We discussed his risk of osteopenia and osteoporosis from aromatase inhibitor -Baseline DEXA from 03/2018 was normal. She has repeatedly declined more DEXA scans due to concern for radiation exposure. -I previously recommend she take Vit D and Calcium daily.    5. Obesity, weight gain -secondary to Anastrozole -Her weight is stable overall. She has tried to alter diet some and walk daily but has not lost weight.  -She again declined referral to [facility].      Plan:  -Continue Anastrozole, refilled today, pt would like to stop after 5 years (the end of July 2023) -mammogram due 03/2022 -Lab and F/u in 6 months    No problem-specific Assessment & Plan notes found for this encounter.   SUMMARY OF ONCOLOGIC HISTORY: Oncology History Overview Note  Cancer Staging Malignant neoplasm of upper-inner quadrant of right breast in female, estrogen receptor positive ([place]) Staging form: Breast, AJCC 8th Edition - Clinical stage from 11/27/2016: Stage IA (cT1c, cN0, cM0, G2, ER: Positive, PR: Positive, HER2: Negative) - Signed by Truitt Merle, MD on 12/05/2016     Malignant neoplasm of upper-inner quadrant of right breast in female, estrogen receptor positive ([place])  11/16/2016 Mammogram   Diagnostic  mammogram and US showed suspicious asymmetry/distortion within the inner right breast, 3 o'clock axis region, at middle depth, without definite sonographic Correlated, axilla (-)   11/27/2016 Receptors her2   ER 90%+ PR 40%+ HER2 - Ki67 5%   11/27/2016 Initial Biopsy   Breast, right, needle core biopsy, 3:00  o'clock - INVASIVE LOBULAR CARCINOMA, G1-2   11/27/2016 Initial Diagnosis   Malignant neoplasm of upper-inner quadrant of right breast in female, estrogen receptor positive ([place])   12/10/2016 Imaging   MR Breast Bilateral IMPRESSION: 1. Post biopsy changes and associated non masslike enhancement in the medial aspect of the right breast. 2. Left breast is negative. 3. No MRI evidence for adenopathy.   12/26/2016 Procedure   Colonoscopy  - Three 3 to 6 mm polyps in the descending colon, removed with a cold snare. Resected and retrieved. - The examination was otherwise normal on direct and retroflexion views.   12/26/2016 Pathology Results   Diagnosis Surgical [P], descending, polyps (3) -TUBULAR ADENOMAS. -NO HIGH GRADE DYSPLASIA OR MALIGNANCY IDENTIFIED.   01/02/2017 Surgery   Right Breast lumpectomy with sentinel lymph node biopsy performed by Dr. Lucia Gaskins.   01/02/2017 Pathology Results   Diagnosis  1. Breast, lumpectomy, Right - INVASIVE LOBULAR CARCINOMA, 2.8 CM. - INVASIVE CARCINOMA BROADLY INVOLVES INFERIOR MARGIN. - PREVIOUS BIOPSY SITE. - FOCAL LYMPH VASCULAR INVOLVEMENT BY TUMOR. 2. Lymph node, sentinel, biopsy, Right Axillary - METASTATIC CARCINOMA IN ONE LYMPH NODE WITH EXTRANODAL EXTENSION (1/1).   01/18/2017 Surgery   RE-EXCISION OF BREAST LUMPECTOMY by Dr. Lucia Gaskins    01/18/2017 Pathology Results   Diagnosis 01/18/17 Breast, excision, Right Inferior Margin - ATYPICAL DUCTAL HYPERPLASIA. - FIBROCYSTIC CHANGES WITH CALCIFICATIONS. - VASCULAR CALCIFICATIONS. - SEE COMMENT. Microscopic Comment The surgical resection margin(s) of the specimen were inked and microscopically evaluated     - 03/20/2017 Radiation Therapy   Radiation by Dr. Isidore Moos    04/2017 -  Anti-estrogen oral therapy   Letrozole daily started in 04/2017. Due to anxiety, weight gain and troule sleeping I swithced her to Anastrozole in 05/2018.    03/24/2018 Imaging   03/24/2018 Bone  Density ASSESSMENT: The BMD measured at Femur Neck Left is 0.944 g/cm2 with a T-score of -0.7. This patient is considered normal according to [facility]) criteria.   03/24/2018 Mammogram   03/24/2018 Mammogram IMPRESSION: Expected surgical changes in the medial central right breast. No mammographic evidence of malignancy in the bilateral breasts.   03/24/2018 Imaging   DEXA ASSESSMENT: The BMD measured at Femur Neck Left is 0.944 g/cm2 with a T-score of -0.7. This patient is considered normal according to [facility]) criteria.   The scan quality is good. L-3, L-4 were excluded due to degenerative changes.   Site Region Measured Date Measured Age YA BMD Significant CHANGE T-score AP Spine  L1-L2     03/24/2018    67.2         -0.5    1.110 g/cm2   DualFemur Neck Left 03/24/2018    67.2         -0.7    0.944 g/cm2   World Health Organization [facility]) criteria for post-menopausal, Caucasian Women: Normal       T-score at or above -1 SD Osteopenia   T-score between -1 and -2.5 SD Osteoporosis T-score at or below -2.5 SD   RECOMMENDATION: 1. All patients should optimize calcium and vitamin D intake. 2. Consider FDA approved medical therapies in postmenopausal women and men aged 73 years  and older, based on the following: a. A hip or vertebral (clinical or morphometric) fracture b. T- score < or = -2.5 at the femoral neck or spine after appropriate evaluation to exclude secondary causes c. Low bone mass (T-score between -1.0 and -2.5 at the femoral neck or spine) and a 10 year probability of a hip fracture > or = 3% or a 10 year probability of a major osteoporosis-related fracture > or = 20% based on the [place]-adapted WHO algorithm d. Clinician judgment and/or patient preferences may indicate treatment for people with 10-year fracture probabilities above or below these levels      INTERVAL HISTORY:  Crystal Logan is here for a  follow up of breast cancer. She was last seen by me on 04/06/21. She presents to the clinic accompanied by her husband, who acts as an interpreter. However, she has been working on her [place]. She reports she is doing well overall. She notes she is walking daily and has a lot of energy. She expressed some concern with memory and brain function. I assured her this is normal for her age and that learning another language is very good for her brain.   All other systems were reviewed with the patient and are negative.  MEDICAL HISTORY:  Past Medical History:  Diagnosis Date   Breast cancer ([place]) 12/2016   right   History of radiation therapy 02/13/17- 03/20/17   Right Breast treated to 50 Gy in 25 fractions   Hypertension    states under control with med., has been on med. ~ 15 yr.   Personal history of radiation therapy 01/08/2017   Ended 02-01-2017    SURGICAL HISTORY: Past Surgical History:  Procedure Laterality Date   BREAST LUMPECTOMY Right 01/02/2017   BREAST LUMPECTOMY WITH RADIOACTIVE SEED AND SENTINEL LYMPH NODE BIOPSY Right 01/02/2017   Procedure: RIGHT BREAST LUMPECTOMY WITH RADIOACTIVE SEED AND RIGHT AXILLARY SENTINEL LYMPH NODE BIOPSY;  Surgeon: Alphonsa Overall, MD;  Location: [facility];  Service: General;  Laterality: Right;   COLONOSCOPY  2018   RE-EXCISION OF BREAST LUMPECTOMY Right 01/18/2017   Procedure: RE-EXCISION OF BREAST LUMPECTOMY;  Surgeon: Alphonsa Overall, MD;  Location: [facility];  Service: General;  Laterality: Right;   TONSILLECTOMY      I have reviewed the social history and family history with the patient and they are unchanged from previous note.  ALLERGIES:  is allergic to no known allergies.  MEDICATIONS:  Current Outpatient Medications  Medication Sig Dispense Refill   anastrozole (ARIMIDEX) 1 MG tablet Take 1 tablet (1 mg total) by mouth daily. 30 tablet 5   CANDESARTAN CILEXETIL PO Take 1 tablet by mouth. Pt not sure of dose     No current  facility-administered medications for this visit.    PHYSICAL EXAMINATION: ECOG PERFORMANCE STATUS: 0 - Asymptomatic  Vitals:   10/05/21 0847  BP: 137/79  Pulse: 87  Resp: 18  Temp: 98.4 F (36.9 C)  SpO2: 97%   Wt Readings from Last 3 Encounters:  10/05/21 233 lb 14.4 oz (106.1 kg)  04/06/21 231 lb 8 oz (105 kg)  10/07/20 231 lb (104.8 kg)     GENERAL:alert, no distress and comfortable SKIN: skin color, texture, turgor are normal, no rashes or significant lesions EYES: normal, Conjunctiva are pink and non-injected, sclera clear  NECK: supple, thyroid normal size, non-tender, without nodularity LYMPH:  no palpable lymphadenopathy in the cervical, axillary  LUNGS: clear to auscultation and percussion with normal breathing  effort HEART: regular rate & rhythm and no murmurs and no lower extremity edema ABDOMEN:abdomen soft, non-tender and normal bowel sounds Musculoskeletal:no cyanosis of digits and no clubbing  NEURO: alert & oriented x 3 with fluent speech, no focal motor/sensory deficits BREAST: (+) mild right breast erythema No palpable mass, nodules or adenopathy bilaterally. Breast exam benign.   LABORATORY DATA:  I have reviewed the data as listed CBC Latest Ref Rng & Units 10/05/2021 04/06/2021 10/07/2020  WBC 4.0 - 10.5 K/uL 5.3 4.6 4.9  Hemoglobin 12.0 - 15.0 g/dL 12.6 13.0 13.1  Hematocrit 36.0 - 46.0 % 38.0 38.9 41.1  Platelets 150 - 400 K/uL 283 252 259     CMP Latest Ref Rng & Units 10/05/2021 04/06/2021 10/07/2020  Glucose 70 - 99 mg/dL 156(H) 147(H) 140(H)  BUN 8 - 23 mg/dL 24(H) 19 12  Creatinine 0.44 - 1.00 mg/dL 0.88 0.97 0.80  Sodium 135 - 145 mmol/L 134(L) 137 137  Potassium 3.5 - 5.1 mmol/L 4.3 4.8 4.4  Chloride 98 - 111 mmol/L 99 103 103  CO2 22 - 32 mmol/L 26 24 27   Calcium 8.9 - 10.3 mg/dL 9.4 9.8 9.3  Total Protein 6.5 - 8.1 g/dL 7.4 7.7 7.5  Total Bilirubin 0.3 - 1.2 mg/dL 0.4 0.4 0.4  Alkaline Phos 38 - 126 U/L 65 80 80  AST 15 - 41 U/L 21 22  20   ALT 0 - 44 U/L 34 37 38      RADIOGRAPHIC STUDIES: I have personally reviewed the radiological images as listed and agreed with the findings in the report. No results found.    Orders Placed This Encounter  Procedures   MM Digital Screening    Standing Status:   Future    Standing Expiration Date:   10/05/2022    Order Specific Question:   Reason for Exam (SYMPTOM  OR DIAGNOSIS REQUIRED)    Answer:   screening    Order Specific Question:   Preferred imaging location?    Answer:   [facility]   All questions were answered. The patient knows to call the clinic with any problems, questions or concerns. No barriers to learning was detected. The total time spent in the appointment was 30 minutes.     Truitt Merle, MD 10/05/2021   I, Wilburn Mylar, am acting as scribe for Truitt Merle, MD.   I have reviewed the above documentation for accuracy and completeness, and I agree with the above.

## 2021-11-01 ENCOUNTER — Ambulatory Visit
Admission: RE | Admit: 2021-11-01 | Discharge: 2021-11-01 | Disposition: A | Discharge status: Home / Self Care | Payer: Medicare Other | Source: Ambulatory Visit | Attending: Internal Medicine | Admitting: Internal Medicine

## 2021-11-01 ENCOUNTER — Other Ambulatory Visit: Payer: Self-pay | Admitting: Internal Medicine

## 2021-11-01 ENCOUNTER — Other Ambulatory Visit: Payer: Self-pay

## 2021-11-01 DIAGNOSIS — R059 Cough, unspecified: Secondary | ICD-10-CM

## 2022-03-30 ENCOUNTER — Ambulatory Visit
Admission: RE | Admit: 2022-03-30 | Discharge: 2022-03-30 | Disposition: A | Discharge status: Home / Self Care | Payer: Medicare Other | Source: Ambulatory Visit | Attending: Hematology | Admitting: Hematology

## 2022-03-30 DIAGNOSIS — Z170 Estrogen receptor positive status [ER+]: Secondary | ICD-10-CM

## 2022-04-03 ENCOUNTER — Other Ambulatory Visit: Payer: Self-pay

## 2022-04-03 DIAGNOSIS — Z170 Estrogen receptor positive status [ER+]: Secondary | ICD-10-CM

## 2022-04-04 ENCOUNTER — Inpatient Hospital Stay (HOSPITAL_BASED_OUTPATIENT_CLINIC_OR_DEPARTMENT_OTHER): Payer: Medicare Other | Admitting: Hematology

## 2022-04-04 ENCOUNTER — Other Ambulatory Visit: Payer: Self-pay

## 2022-04-04 ENCOUNTER — Encounter: Payer: Self-pay | Admitting: Hematology

## 2022-04-04 ENCOUNTER — Inpatient Hospital Stay: Payer: Medicare Other | Attending: Hematology

## 2022-04-04 VITALS — BP 145/71 | HR 88 | Temp 98.2°F | Wt 226.6 lb

## 2022-04-04 DIAGNOSIS — E661 Drug-induced obesity: Secondary | ICD-10-CM | POA: Diagnosis not present

## 2022-04-04 DIAGNOSIS — C50211 Malignant neoplasm of upper-inner quadrant of right female breast: Secondary | ICD-10-CM

## 2022-04-04 DIAGNOSIS — Z170 Estrogen receptor positive status [ER+]: Secondary | ICD-10-CM | POA: Insufficient documentation

## 2022-04-04 DIAGNOSIS — Z87891 Personal history of nicotine dependence: Secondary | ICD-10-CM | POA: Insufficient documentation

## 2022-04-04 DIAGNOSIS — C773 Secondary and unspecified malignant neoplasm of axilla and upper limb lymph nodes: Secondary | ICD-10-CM | POA: Insufficient documentation

## 2022-04-04 DIAGNOSIS — Z79811 Long term (current) use of aromatase inhibitors: Secondary | ICD-10-CM | POA: Diagnosis not present

## 2022-04-04 DIAGNOSIS — I10 Essential (primary) hypertension: Secondary | ICD-10-CM | POA: Insufficient documentation

## 2022-04-04 LAB — CMP (CANCER CENTER ONLY)
ALT: 34 U/L (ref 0–44)
AST: 20 U/L (ref 15–41)
Albumin: 4.3 g/dL (ref 3.5–5.0)
Alkaline Phosphatase: 73 U/L (ref 38–126)
Anion gap: 8 (ref 5–15)
BUN: 27 mg/dL — ABNORMAL HIGH (ref 8–23)
CO2: 30 mmol/L (ref 22–32)
Calcium: 9.4 mg/dL (ref 8.9–10.3)
Chloride: 100 mmol/L (ref 98–111)
Creatinine: 0.95 mg/dL (ref 0.44–1.00)
GFR, Estimated: >60 mL/min (ref >60)
Glucose, Bld: 151 mg/dL — ABNORMAL HIGH (ref 70–99)
Potassium: 4.3 mmol/L (ref 3.5–5.1)
Sodium: 138 mmol/L (ref 135–145)
Total Bilirubin: 0.5 mg/dL (ref 0.3–1.2)
Total Protein: 7.3 g/dL (ref 6.5–8.1)

## 2022-04-04 LAB — CBC WITH DIFFERENTIAL (CANCER CENTER ONLY)
Abs Immature Granulocytes: 0.01 10*3/uL (ref 0.00–0.07)
Basophils Absolute: 0.1 10*3/uL (ref 0.0–0.1)
Basophils Relative: 2 %
Eosinophils Absolute: 0.2 10*3/uL (ref 0.0–0.5)
Eosinophils Relative: 5 %
HCT: 35.9 % — ABNORMAL LOW (ref 36.0–46.0)
Hemoglobin: 12.1 g/dL (ref 12.0–15.0)
Immature Granulocytes: 0 %
Lymphocytes Relative: 29 %
Lymphs Abs: 1.4 10*3/uL (ref 0.7–4.0)
MCH: 30.6 pg (ref 26.0–34.0)
MCHC: 33.7 g/dL (ref 30.0–36.0)
MCV: 90.9 fL (ref 80.0–100.0)
Monocytes Absolute: 0.7 10*3/uL (ref 0.1–1.0)
Monocytes Relative: 15 %
Neutro Abs: 2.3 10*3/uL (ref 1.7–7.7)
Neutrophils Relative %: 49 %
Platelet Count: 273 10*3/uL (ref 150–400)
RBC: 3.95 MIL/uL (ref 3.87–5.11)
RDW: 12.8 % (ref 11.5–15.5)
WBC Count: 4.7 10*3/uL (ref 4.0–10.5)
nRBC: 0 % (ref 0.0–0.2)

## 2022-04-04 NOTE — Progress Notes (Addendum)
[place]   Telephone:(336) [phone] Fax:(336) [phone]   Clinic Follow up Note   Patient Care Team: Jilda Panda, MD as PCP - General (Internal Medicine) Alphonsa Overall, MD as Consulting Physician (General Surgery) Truitt Merle, MD as Consulting Physician (Hematology) Eppie Gibson, MD as Attending Physician (Radiation Oncology) Gardenia Phlegm, NP as Nurse Practitioner (Hematology and Oncology)  Date of Service:  04/04/2022  CHIEF COMPLAINT: f/u of right breast cancer  CURRENT THERAPY:  Antiestrogen therapy, starting 04/10/17, plan for 7 years  -currently on anastrozole since 05/2018  ASSESSMENT & PLAN:  Crystal Logan is a 71 y.o. post-menopausal female with   1. 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  -She started antiestrogen therapy with letrozole in 04/2017. Due to weight gain, anxiety and trouble sleeping, I switched her to Anastrozole in 05/2018.  She will complete 5 years therapy at this months.  I encouraged her to consider additional 2 years of anastrozole, to reduce her risk of recurrence. She declines to take for longer; she will complete her current refill this month, then stop. -most recent mammogram on 03/30/22 was negative. -She is clinically doing well, denies any pain or other concerning signs, tolerating anastrozole well overall. Labs reviewed, WNL. Physical exam was unremarkable (breast exam deferred due to recent mammogram). No clinical concern for recurrence.  -Lab and follow-up in 6 months, then annually.   2. Tobacco use -She started smoking at 71 years-old. She previously smoked 1 ppd  -She has stopped smoking previously.    3. HTN, Health Management  -medication and f/u per PCP. -Her 03/2020 colonoscopy with Dr. Ardis Hughs showed benign polyps.  Recall in 7 years.   4. Bone Health  -We discussed his risk of osteopenia and osteoporosis from aromatase inhibitor -Baseline DEXA from 03/2018 was normal. She has repeatedly declined more DEXA scans due to concern for radiation exposure. -I again recommend she take Vit D and Calcium daily.    5. Obesity, weight gain -secondary to Anastrozole -she was able to lose 8 lbs in the last 6 months.     Plan:  -Continue Anastrozole to finish current refill, then stop in a few weeks  -Lab and F/u in 6 months, then yearly after next visit    No problem-specific Assessment & Plan notes found for this encounter.   SUMMARY OF ONCOLOGIC HISTORY: Oncology History Overview Note  Cancer Staging Malignant neoplasm of upper-inner quadrant of right breast in female, estrogen receptor positive ([place]) Staging form: Breast, AJCC 8th Edition - Clinical stage from 11/27/2016: Stage IA (cT1c, cN0, cM0, G2, ER: Positive, PR: Positive, HER2: Negative) - Signed by Truitt Merle, MD on 12/05/2016     Malignant neoplasm of upper-inner quadrant of right breast in female, estrogen receptor positive ([place])  11/16/2016 Mammogram   Diagnostic mammogram and US showed suspicious asymmetry/distortion within the inner right breast, 3 o'clock axis region, at middle depth, without definite sonographic Correlated, axilla (-)   11/27/2016 Receptors her2   ER 90%+ PR 40%+ HER2 - Ki67 5%   11/27/2016 Initial Biopsy   Breast, right, needle core biopsy, 3:00 o'clock - INVASIVE LOBULAR CARCINOMA, G1-2   11/27/2016 Initial Diagnosis   Malignant neoplasm of upper-inner quadrant of right breast in female, estrogen receptor positive ([place])   12/10/2016 Imaging   MR Breast Bilateral IMPRESSION: 1. Post  biopsy changes and associated non masslike enhancement in the medial aspect of the right breast. 2. Left breast is negative. 3. No MRI evidence for adenopathy.   12/26/2016 Procedure   Colonoscopy  - Three 3 to 6 mm polyps in the  descending colon, removed with a cold snare. Resected and retrieved. - The examination was otherwise normal on direct and retroflexion views.   12/26/2016 Pathology Results   Diagnosis Surgical [P], descending, polyps (3) -TUBULAR ADENOMAS. -NO HIGH GRADE DYSPLASIA OR MALIGNANCY IDENTIFIED.   01/02/2017 Surgery   Right Breast lumpectomy with sentinel lymph node biopsy performed by Dr. Lucia Gaskins.   01/02/2017 Pathology Results   Diagnosis  1. Breast, lumpectomy, Right - INVASIVE LOBULAR CARCINOMA, 2.8 CM. - INVASIVE CARCINOMA BROADLY INVOLVES INFERIOR MARGIN. - PREVIOUS BIOPSY SITE. - FOCAL LYMPH VASCULAR INVOLVEMENT BY TUMOR. 2. Lymph node, sentinel, biopsy, Right Axillary - METASTATIC CARCINOMA IN ONE LYMPH NODE WITH EXTRANODAL EXTENSION (1/1).   01/18/2017 Surgery   RE-EXCISION OF BREAST LUMPECTOMY by Dr. Lucia Gaskins    01/18/2017 Pathology Results   Diagnosis 01/18/17 Breast, excision, Right Inferior Margin - ATYPICAL DUCTAL HYPERPLASIA. - FIBROCYSTIC CHANGES WITH CALCIFICATIONS. - VASCULAR CALCIFICATIONS. - SEE COMMENT. Microscopic Comment The surgical resection margin(s) of the specimen were inked and microscopically evaluated     - 03/20/2017 Radiation Therapy   Radiation by Dr. Isidore Moos    04/2017 -  Anti-estrogen oral therapy   Letrozole daily started in 04/2017. Due to anxiety, weight gain and troule sleeping I swithced her to Anastrozole in 05/2018.    03/24/2018 Imaging   03/24/2018 Bone Density ASSESSMENT: The BMD measured at Femur Neck Left is 0.944 g/cm2 with a T-score of -0.7. This patient is considered normal according to [facility]) criteria.   03/24/2018 Mammogram   03/24/2018 Mammogram IMPRESSION: Expected surgical changes in the medial central right breast. No mammographic evidence of malignancy in the bilateral breasts.   03/24/2018 Imaging   DEXA ASSESSMENT: The BMD measured at Femur Neck Left is 0.944 g/cm2 with a T-score of -0.7. This  patient is considered normal according to [facility]) criteria.   The scan quality is good. L-3, L-4 were excluded due to degenerative changes.   Site Region Measured Date Measured Age YA BMD Significant CHANGE T-score AP Spine  L1-L2     03/24/2018    67.2         -0.5    1.110 g/cm2   DualFemur Neck Left 03/24/2018    67.2         -0.7    0.944 g/cm2   World Health Organization [facility]) criteria for post-menopausal, Caucasian Women: Normal       T-score at or above -1 SD Osteopenia   T-score between -1 and -2.5 SD Osteoporosis T-score at or below -2.5 SD   RECOMMENDATION: 1. All patients should optimize calcium and vitamin D intake. 2. Consider FDA approved medical therapies in postmenopausal women and men aged 29 years and older, based on the following: a. A hip or vertebral (clinical or morphometric) fracture b. T- score < or = -2.5 at the femoral neck or spine after appropriate evaluation to exclude secondary causes c. Low bone mass (T-score between -1.0 and -2.5 at the femoral neck or spine) and a 10 year probability of a hip fracture > or = 3% or a 10 year probability of a major osteoporosis-related fracture > or = 20% based on the [place]-adapted WHO algorithm d. Clinician judgment and/or patient preferences may indicate  treatment for people with 10-year fracture probabilities above or below these levels      INTERVAL HISTORY:  Crystal Logan is here for a follow up of breast cancer. She was last seen by me on 10/05/21. She presents to the clinic accompanied by her husband, who acts as our interpreter as needed. She reports she is doing well overall.   All other systems were reviewed with the patient and are negative.  MEDICAL HISTORY:  Past Medical History:  Diagnosis Date   Breast cancer ([place]) 12/2016   right   History of radiation therapy 02/13/17- 03/20/17   Right Breast treated to 50 Gy in 25 fractions   Hypertension    states under  control with med., has been on med. ~ 15 yr.   Personal history of radiation therapy 01/08/2017   Ended 02-01-2017    SURGICAL HISTORY: Past Surgical History:  Procedure Laterality Date   BREAST LUMPECTOMY Right 01/02/2017   BREAST LUMPECTOMY WITH RADIOACTIVE SEED AND SENTINEL LYMPH NODE BIOPSY Right 01/02/2017   Procedure: RIGHT BREAST LUMPECTOMY WITH RADIOACTIVE SEED AND RIGHT AXILLARY SENTINEL LYMPH NODE BIOPSY;  Surgeon: Alphonsa Overall, MD;  Location: [facility];  Service: General;  Laterality: Right;   COLONOSCOPY  2018   RE-EXCISION OF BREAST LUMPECTOMY Right 01/18/2017   Procedure: RE-EXCISION OF BREAST LUMPECTOMY;  Surgeon: Alphonsa Overall, MD;  Location: [facility];  Service: General;  Laterality: Right;   TONSILLECTOMY      I have reviewed the social history and family history with the patient and they are unchanged from previous note.  ALLERGIES:  is allergic to no known allergies.  MEDICATIONS:  Current Outpatient Medications  Medication Sig Dispense Refill   anastrozole (ARIMIDEX) 1 MG tablet Take 1 tablet (1 mg total) by mouth daily. 30 tablet 5   CANDESARTAN CILEXETIL PO Take 1 tablet by mouth. Pt not sure of dose     No current facility-administered medications for this visit.    PHYSICAL EXAMINATION: ECOG PERFORMANCE STATUS: 0 - Asymptomatic  Vitals:   04/04/22 0850  BP: (!) 145/71  Pulse: 88  Temp: 98.2 F (36.8 C)  SpO2: 96%   Wt Readings from Last 3 Encounters:  04/04/22 226 lb 9.6 oz (102.8 kg)  10/05/21 233 lb 14.4 oz (106.1 kg)  04/06/21 231 lb 8 oz (105 kg)     GENERAL:alert, no distress and comfortable SKIN: skin color, texture, turgor are normal, no rashes or significant lesions EYES: normal, Conjunctiva are pink and non-injected, sclera clear  NECK: supple, thyroid normal size, non-tender, without nodularity LYMPH:  no palpable lymphadenopathy in the cervical, axillary LUNGS: clear to auscultation and percussion with normal breathing  effort HEART: regular rate & rhythm and no murmurs and no lower extremity edema ABDOMEN:abdomen soft, non-tender and normal bowel sounds Musculoskeletal:no cyanosis of digits and no clubbing  NEURO: alert & oriented x 3 with fluent speech, no focal motor/sensory deficits  LABORATORY DATA:  I have reviewed the data as listed    Latest Ref Rng & Units 04/04/2022    8:33 AM 10/05/2021    8:31 AM 04/06/2021    8:44 AM  CBC  WBC 4.0 - 10.5 K/uL 4.7  5.3  4.6   Hemoglobin 12.0 - 15.0 g/dL 12.1  12.6  13.0   Hematocrit 36.0 - 46.0 % 35.9  38.0  38.9   Platelets 150 - 400 K/uL 273  283  252         Latest Ref Rng &  Units 04/04/2022    8:33 AM 10/05/2021    8:31 AM 04/06/2021    8:44 AM  CMP  Glucose 70 - 99 mg/dL 151  156  147   BUN 8 - 23 mg/dL _0 Creatinine 0.44 - 1.00 mg/dL 0.95  0.88  0.97   Sodium 135 - 145 mmol/L 138  134  137   Potassium 3.5 - 5.1 mmol/L 4.3  4.3  4.8   Chloride 98 - 111 mmol/L 100  99  103   CO2 22 - 32 mmol/L _1 Calcium 8.9 - 10.3 mg/dL 9.4  9.4  9.8   Total Protein 6.5 - 8.1 g/dL 7.3  7.4  7.7   Total Bilirubin 0.3 - 1.2 mg/dL 0.5  0.4  0.4   Alkaline Phos 38 - 126 U/L 73  65  80   AST 15 - 41 U/L _2 ALT 0 - 44 U/L 34  34  37       RADIOGRAPHIC STUDIES: I have personally reviewed the radiological images as listed and agreed with the findings in the report. No results found.    No orders of the defined types were placed in this encounter.  All questions were answered. The patient knows to call the clinic with any problems, questions or concerns. No barriers to learning was detected. The total time spent in the appointment was 30 minutes.     Truitt Merle, MD 04/04/2022   I, Wilburn Mylar, am acting as scribe for Truitt Merle, MD.   I have reviewed the above documentation for accuracy and completeness, and I agree with the above.

## 2022-04-06 ENCOUNTER — Telehealth: Payer: Self-pay | Admitting: Hematology

## 2022-04-06 NOTE — Telephone Encounter (Signed)
Left message with follow-up appointment per 7/26 los.

## 2022-04-23 IMAGING — CR DG CHEST 2V
2 series · 2 of 2 positions shown · non-contrast
Comparison: Chest radiograph 07/17/2012.

CLINICAL DATA: History of breast cancer.  Chronic cough.

EXAM:
CHEST - 2 VIEW

[w chest pa]
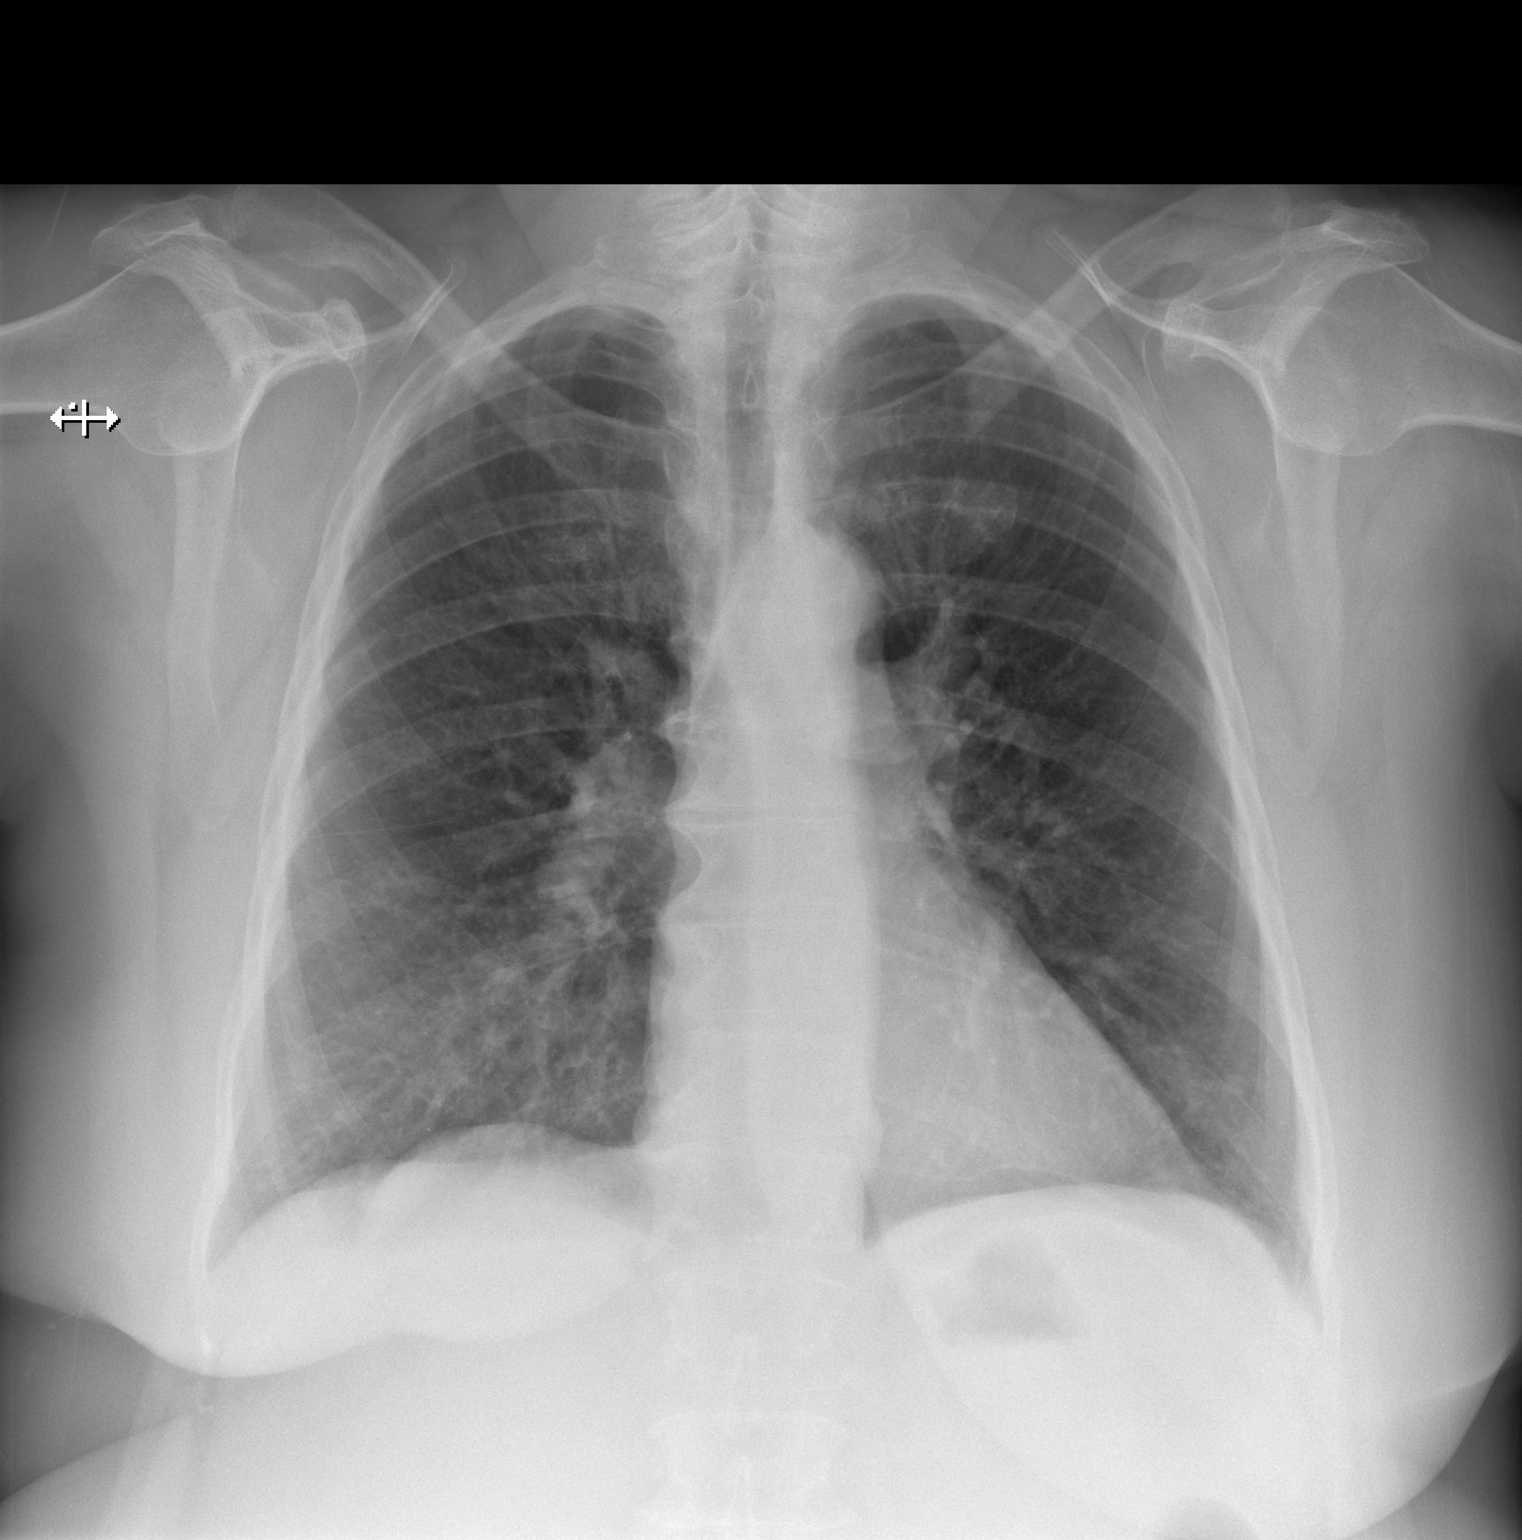

[w chest lat]
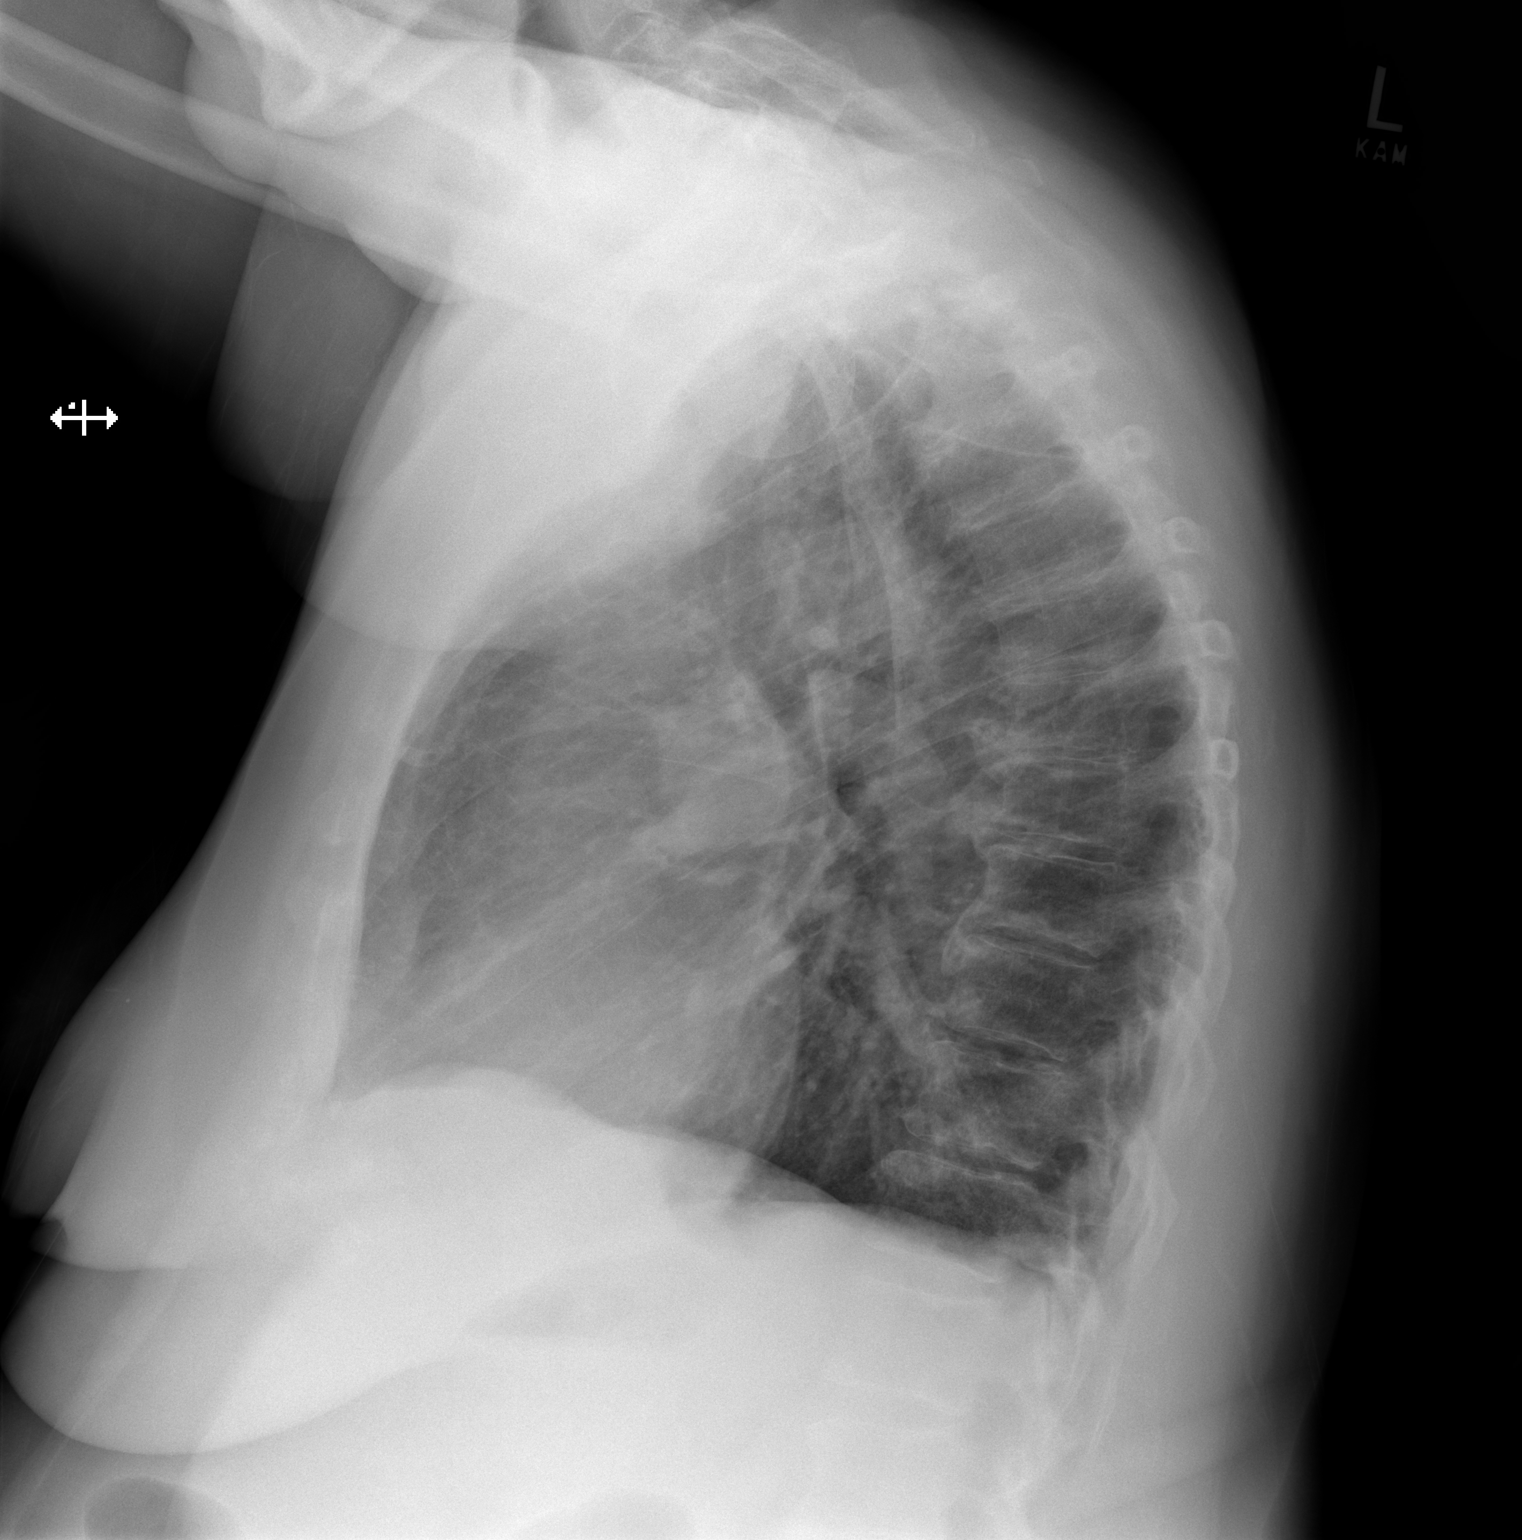

[2 of 2 positions shown; findings below may reference images not displayed]

FINDINGS: Stable cardiac and mediastinal contours. No consolidative pulmonary
opacities. No pleural effusion or pneumothorax. Thoracic spine
degenerative changes.
IMPRESSION: No acute cardiopulmonary process.

## 2022-08-20 ENCOUNTER — Other Ambulatory Visit: Payer: Self-pay | Admitting: Internal Medicine

## 2022-08-20 DIAGNOSIS — Z79811 Long term (current) use of aromatase inhibitors: Secondary | ICD-10-CM

## 2022-08-20 DIAGNOSIS — E2839 Other primary ovarian failure: Secondary | ICD-10-CM

## 2022-10-04 ENCOUNTER — Telehealth: Payer: Self-pay | Admitting: Hematology

## 2022-10-04 NOTE — Telephone Encounter (Signed)
Per 1/25 IB, message left

## 2022-10-05 ENCOUNTER — Inpatient Hospital Stay: Payer: Medicare Other

## 2022-10-05 ENCOUNTER — Inpatient Hospital Stay: Payer: Medicare Other | Admitting: Hematology

## 2022-10-05 ENCOUNTER — Inpatient Hospital Stay: Payer: Medicare Other | Attending: Hematology

## 2022-10-05 NOTE — Progress Notes (Deleted)
[place]   Telephone:([phone] Fax:([phone]   Clinic Follow up Note   Patient Care Team: Jilda Panda, MD as PCP - General (Internal Medicine) Alphonsa Overall, MD as Consulting Physician (General Surgery) Truitt Merle, MD as Consulting Physician (Hematology) Eppie Gibson, MD as Attending Physician (Radiation Oncology) Gardenia Phlegm, NP as Nurse Practitioner (Hematology and Oncology)  Date of Service:  10/05/2022  CHIEF COMPLAINT: f/u of right breast cancer   CURRENT THERAPY:   Antiestrogen therapy, starting 04/10/17, plan for 7 years             -currently on anastrozole since 05/2018    ASSESSMENT: *** Crystal Logan is a 72 y.o. female with   No problem-specific Assessment & Plan notes found for this encounter.  ***   PLAN: {Everything Dr. Burr Medico talks to pt about, including reviewing scans and labs. } -{proceed with ***} -{lab with/without flush and f/u when?}   SUMMARY OF ONCOLOGIC HISTORY: Oncology History Overview Note  Cancer Staging Malignant neoplasm of upper-inner quadrant of right breast in female, estrogen receptor positive ([place]) Staging form: Breast, AJCC 8th Edition - Clinical stage from 11/27/2016: Stage IA (cT1c, cN0, cM0, G2, ER: Positive, PR: Positive, HER2: Negative) - Signed by Truitt Merle, MD on 12/05/2016     Malignant neoplasm of upper-inner quadrant of right breast in female, estrogen receptor positive ([place])  11/16/2016 Mammogram   Diagnostic mammogram and US showed suspicious asymmetry/distortion within the inner right breast, 3 o'clock axis region, at middle depth, without definite sonographic Correlated, axilla (-)   11/27/2016 Receptors her2   ER 90%+ PR 40%+ HER2 - Ki67 5%   11/27/2016 Initial Biopsy   Breast, right, needle core biopsy, 3:00 o'clock - INVASIVE LOBULAR CARCINOMA, G1-2   11/27/2016 Initial Diagnosis   Malignant neoplasm of upper-inner quadrant of right breast in female, estrogen receptor  positive ([place])   12/10/2016 Imaging   MR Breast Bilateral IMPRESSION: 1. Post biopsy changes and associated non masslike enhancement in the medial aspect of the right breast. 2. Left breast is negative. 3. No MRI evidence for adenopathy.   12/26/2016 Procedure   Colonoscopy  - Three 3 to 6 mm polyps in the descending colon, removed with a cold snare. Resected and retrieved. - The examination was otherwise normal on direct and retroflexion views.   12/26/2016 Pathology Results   Diagnosis Surgical [P], descending, polyps (3) -TUBULAR ADENOMAS. -NO HIGH GRADE DYSPLASIA OR MALIGNANCY IDENTIFIED.   01/02/2017 Surgery   Right Breast lumpectomy with sentinel lymph node biopsy performed by Dr. Lucia Gaskins.   01/02/2017 Pathology Results   Diagnosis  1. Breast, lumpectomy, Right - INVASIVE LOBULAR CARCINOMA, 2.8 CM. - INVASIVE CARCINOMA BROADLY INVOLVES INFERIOR MARGIN. - PREVIOUS BIOPSY SITE. - FOCAL LYMPH VASCULAR INVOLVEMENT BY TUMOR. 2. Lymph node, sentinel, biopsy, Right Axillary - METASTATIC CARCINOMA IN ONE LYMPH NODE WITH EXTRANODAL EXTENSION (1/1).   01/18/2017 Surgery   RE-EXCISION OF BREAST LUMPECTOMY by Dr. Lucia Gaskins    01/18/2017 Pathology Results   Diagnosis 01/18/17 Breast, excision, Right Inferior Margin - ATYPICAL DUCTAL HYPERPLASIA. - FIBROCYSTIC CHANGES WITH CALCIFICATIONS. - VASCULAR CALCIFICATIONS. - SEE COMMENT. Microscopic Comment The surgical resection margin(s) of the specimen were inked and microscopically evaluated     - 03/20/2017 Radiation Therapy   Radiation by Dr. Isidore Moos    04/2017 -  Anti-estrogen oral therapy   Letrozole daily started in 04/2017. Due to anxiety, weight gain and troule sleeping I swithced her to Anastrozole in 05/2018.  03/24/2018 Imaging   03/24/2018 Bone Density ASSESSMENT: The BMD measured at Femur Neck Left is 0.944 g/cm2 with a T-score of -0.7. This patient is considered normal according to [facility])  criteria.   03/24/2018 Mammogram   03/24/2018 Mammogram IMPRESSION: Expected surgical changes in the medial central right breast. No mammographic evidence of malignancy in the bilateral breasts.   03/24/2018 Imaging   DEXA ASSESSMENT: The BMD measured at Femur Neck Left is 0.944 g/cm2 with a T-score of -0.7. This patient is considered normal according to [facility]) criteria.   The scan quality is good. L-3, L-4 were excluded due to degenerative changes.   Site Region Measured Date Measured Age YA BMD Significant CHANGE T-score AP Spine  L1-L2     03/24/2018    67.2         -0.5    1.110 g/cm2   DualFemur Neck Left 03/24/2018    67.2         -0.7    0.944 g/cm2   World Health Organization [facility]) criteria for post-menopausal, Caucasian Women: Normal       T-score at or above -1 SD Osteopenia   T-score between -1 and -2.5 SD Osteoporosis T-score at or below -2.5 SD   RECOMMENDATION: 1. All patients should optimize calcium and vitamin D intake. 2. Consider FDA approved medical therapies in postmenopausal women and men aged 64 years and older, based on the following: a. A hip or vertebral (clinical or morphometric) fracture b. T- score < or = -2.5 at the femoral neck or spine after appropriate evaluation to exclude secondary causes c. Low bone mass (T-score between -1.0 and -2.5 at the femoral neck or spine) and a 10 year probability of a hip fracture > or = 3% or a 10 year probability of a major osteoporosis-related fracture > or = 20% based on the [place]-adapted WHO algorithm d. Clinician judgment and/or patient preferences may indicate treatment for people with 10-year fracture probabilities above or below these levels      INTERVAL HISTORY: *** Crystal Logan is here for a follow up of right breast cancer  She was last seen by Dr.Feng on 04/04/2022 She presents to the clinic     All other systems were reviewed with the patient and are  negative.  MEDICAL HISTORY:  Past Medical History:  Diagnosis Date   Breast cancer ([place]) 12/2016   right   History of radiation therapy 02/13/17- 03/20/17   Right Breast treated to 50 Gy in 25 fractions   Hypertension    states under control with med., has been on med. ~ 15 yr.   Personal history of radiation therapy 01/08/2017   Ended 02-01-2017    SURGICAL HISTORY: Past Surgical History:  Procedure Laterality Date   BREAST LUMPECTOMY Right 01/02/2017   BREAST LUMPECTOMY WITH RADIOACTIVE SEED AND SENTINEL LYMPH NODE BIOPSY Right 01/02/2017   Procedure: RIGHT BREAST LUMPECTOMY WITH RADIOACTIVE SEED AND RIGHT AXILLARY SENTINEL LYMPH NODE BIOPSY;  Surgeon: Alphonsa Overall, MD;  Location: [place];  Service: General;  Laterality: Right;   COLONOSCOPY  2018   RE-EXCISION OF BREAST LUMPECTOMY Right 01/18/2017   Procedure: RE-EXCISION OF BREAST LUMPECTOMY;  Surgeon: Alphonsa Overall, MD;  Location: [place];  Service: General;  Laterality: Right;   TONSILLECTOMY      I have reviewed the social history and family history with the patient and they are unchanged from previous note.  ALLERGIES:  is allergic to  no known allergies.  MEDICATIONS:  Current Outpatient Medications  Medication Sig Dispense Refill   anastrozole (ARIMIDEX) 1 MG tablet Take 1 tablet (1 mg total) by mouth daily. 30 tablet 5   CANDESARTAN CILEXETIL PO Take 1 tablet by mouth. Pt not sure of dose     No current facility-administered medications for this visit.    PHYSICAL EXAMINATION: ECOG PERFORMANCE STATUS: {CHL ONC ECOG [phone]}  There were no vitals filed for this visit. Wt Readings from Last 3 Encounters:  04/04/22 226 lb 9.6 oz (102.8 kg)  10/05/21 233 lb 14.4 oz (106.1 kg)  04/06/21 231 lb 8 oz (105 kg)    {Only keep what was examined. If exam not performed, can use .CEXAM } GENERAL:alert, no distress and comfortable SKIN: skin color, texture, turgor are normal, no rashes or significant  lesions EYES: normal, Conjunctiva are pink and non-injected, sclera clear {OROPHARYNX:no exudate, no erythema and lips, buccal mucosa, and tongue normal}  NECK: supple, thyroid normal size, non-tender, without nodularity LYMPH:  no palpable lymphadenopathy in the cervical, axillary {or inguinal} LUNGS: clear to auscultation and percussion with normal breathing effort HEART: regular rate & rhythm and no murmurs and no lower extremity edema ABDOMEN:abdomen soft, non-tender and normal bowel sounds Musculoskeletal:no cyanosis of digits and no clubbing  NEURO: alert & oriented x 3 with fluent speech, no focal motor/sensory deficits  LABORATORY DATA:  I have reviewed the data as listed    Latest Ref Rng & Units 04/04/2022    8:33 AM 10/05/2021    8:31 AM 04/06/2021    8:44 AM  CBC  WBC 4.0 - 10.5 K/uL 4.7  5.3  4.6   Hemoglobin 12.0 - 15.0 g/dL 12.1  12.6  13.0   Hematocrit 36.0 - 46.0 % 35.9  38.0  38.9   Platelets 150 - 400 K/uL 273  283  252         Latest Ref Rng & Units 04/04/2022    8:33 AM 10/05/2021    8:31 AM 04/06/2021    8:44 AM  CMP  Glucose 70 - 99 mg/dL 151  156  147   BUN 8 - 23 mg/dL '27  24  19   '$ Creatinine 0.44 - 1.00 mg/dL 0.95  0.88  0.97   Sodium 135 - 145 mmol/L 138  134  137   Potassium 3.5 - 5.1 mmol/L 4.3  4.3  4.8   Chloride 98 - 111 mmol/L 100  99  103   CO2 22 - 32 mmol/L '30  26  24   '$ Calcium 8.9 - 10.3 mg/dL 9.4  9.4  9.8   Total Protein 6.5 - 8.1 g/dL 7.3  7.4  7.7   Total Bilirubin 0.3 - 1.2 mg/dL 0.5  0.4  0.4   Alkaline Phos 38 - 126 U/L 73  65  80   AST 15 - 41 U/L '20  21  22   '$ ALT 0 - 44 U/L 34  34  37       RADIOGRAPHIC STUDIES: I have personally reviewed the radiological images as listed and agreed with the findings in the report. No results found.    No orders of the defined types were placed in this encounter.  All questions were answered. The patient knows to call the clinic with any problems, questions or concerns. No barriers to  learning was detected. The total time spent in the appointment was {CHL ONC TIME VISIT - [id number].     Baldemar Friday,  CMA 10/05/2022   I, Audry Riles, CMA, am acting as scribe for Truitt Merle, MD.   {Add scribe attestation statement}

## 2022-10-05 NOTE — Assessment & Plan Note (Deleted)
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  -She started antiestrogen therapy with letrozole in 04/2017. Due to weight gain, anxiety and trouble sleeping, I switched her to Anastrozole in 05/2018.  She completed 5 years therapy in 03/2022. She declined extended antiestrogen therapy due to the side effects.  -most recent mammogram on 03/30/22 was negative. -Continue breast cancer surveillance, see her annually.

## 2022-10-24 NOTE — Progress Notes (Unsigned)
[employer]   Telephone:(336) [phone] Fax:(336) [phone]   Clinic Follow up Note   Patient Care Team: Jilda Panda, MD as PCP - General (Internal Medicine) Alphonsa Overall, MD as Consulting Physician (General Surgery) Truitt Merle, MD as Consulting Physician (Hematology) Eppie Gibson, MD as Attending Physician (Radiation Oncology) Gardenia Phlegm, NP as Nurse Practitioner (Hematology and Oncology)  Date of Service:  10/25/2022  CHIEF COMPLAINT: f/u of right breast cancer   CURRENT THERAPY:  Antiestrogen therapy, starting 04/10/17, plan for 7 years             -currently on anastrozole since 05/2018  ASSESSMENT:  Crystal Logan is a 72 y.o. 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  -She started antiestrogen therapy with letrozole in 04/2017. Due to weight gain, anxiety and trouble sleeping, I switched her to Anastrozole in 05/2018.  She completed 5 years therapy in July 2023.  She declined extended AI. -most recent mammogram on 03/30/22 was negative. -She is clinically doing well overall, she has multiple joint pain which are likely related to her arthritis.  Labs reviewed, WNL. Physical exam was unremarkable. No clinical concern for recurrence.  -she is 5 years out of her initial diagnosis, we will follow-up her annually.  Given the lobular histology, I again reviewed the risk of later recurrence. -Will follow-up yearly.  Continue breast cancer surveillance.   PLAN: -lab reviewed -Order Mammogram 03/2023 -f/u in 1 year -Recommend Pt to exercise to help with joint pain     SUMMARY OF ONCOLOGIC HISTORY: Oncology History Overview Note  Cancer Staging Malignant neoplasm of upper-inner quadrant of right breast in  female, estrogen receptor positive ([place]) Staging form: Breast, AJCC 8th Edition - Clinical stage from 11/27/2016: Stage IA (cT1c, cN0, cM0, G2, ER: Positive, PR: Positive, HER2: Negative) - Signed by Truitt Merle, MD on 12/05/2016     Malignant neoplasm of upper-inner quadrant of right breast in female, estrogen receptor positive ([place])  11/16/2016 Mammogram   Diagnostic mammogram and US showed suspicious asymmetry/distortion within the inner right breast, 3 o'clock axis region, at middle depth, without definite sonographic Correlated, axilla (-)   11/27/2016 Receptors her2   ER 90%+ PR 40%+ HER2 - Ki67 5%   11/27/2016 Initial Biopsy   Breast, right, needle core biopsy, 3:00 o'clock - INVASIVE LOBULAR CARCINOMA, G1-2   11/27/2016 Initial Diagnosis   Malignant neoplasm of upper-inner quadrant of right breast in female, estrogen receptor positive ([place])   12/10/2016 Imaging   MR Breast Bilateral IMPRESSION: 1. Post biopsy changes and associated non masslike enhancement in the medial aspect of the right breast. 2. Left breast is negative. 3. No MRI evidence for adenopathy.   12/26/2016 Procedure   Colonoscopy  - Three 3 to 6 mm polyps in the descending colon, removed with a cold snare. Resected and retrieved. - The examination was otherwise normal on direct and retroflexion views.   12/26/2016 Pathology Results   Diagnosis Surgical [P], descending, polyps (3) -TUBULAR ADENOMAS. -NO HIGH GRADE DYSPLASIA OR MALIGNANCY IDENTIFIED.   01/02/2017 Surgery   Right Breast lumpectomy with sentinel lymph node biopsy performed by Dr. Lucia Gaskins.   01/02/2017 Pathology Results   Diagnosis  1. Breast, lumpectomy, Right - INVASIVE LOBULAR CARCINOMA, 2.8 CM. - INVASIVE CARCINOMA BROADLY INVOLVES INFERIOR  MARGIN. - PREVIOUS BIOPSY SITE. - FOCAL LYMPH VASCULAR INVOLVEMENT BY TUMOR. 2. Lymph node, sentinel, biopsy, Right Axillary - METASTATIC CARCINOMA IN ONE LYMPH NODE WITH EXTRANODAL EXTENSION (1/1).    01/18/2017 Surgery   RE-EXCISION OF BREAST LUMPECTOMY by Dr. Lucia Gaskins    01/18/2017 Pathology Results   Diagnosis 01/18/17 Breast, excision, Right Inferior Margin - ATYPICAL DUCTAL HYPERPLASIA. - FIBROCYSTIC CHANGES WITH CALCIFICATIONS. - VASCULAR CALCIFICATIONS. - SEE COMMENT. Microscopic Comment The surgical resection margin(s) of the specimen were inked and microscopically evaluated     - 03/20/2017 Radiation Therapy   Radiation by Dr. Isidore Moos    04/2017 -  Anti-estrogen oral therapy   Letrozole daily started in 04/2017. Due to anxiety, weight gain and troule sleeping I swithced her to Anastrozole in 05/2018.    03/24/2018 Imaging   03/24/2018 Bone Density ASSESSMENT: The BMD measured at Femur Neck Left is 0.944 g/cm2 with a T-score of -0.7. This patient is considered normal according to [facility]) criteria.   03/24/2018 Mammogram   03/24/2018 Mammogram IMPRESSION: Expected surgical changes in the medial central right breast. No mammographic evidence of malignancy in the bilateral breasts.   03/24/2018 Imaging   DEXA ASSESSMENT: The BMD measured at Femur Neck Left is 0.944 g/cm2 with a T-score of -0.7. This patient is considered normal according to [facility]) criteria.   The scan quality is good. L-3, L-4 were excluded due to degenerative changes.   Site Region Measured Date Measured Age YA BMD Significant CHANGE T-score AP Spine  L1-L2     03/24/2018    67.2         -0.5    1.110 g/cm2   DualFemur Neck Left 03/24/2018    67.2         -0.7    0.944 g/cm2   World Health Organization [facility]) criteria for post-menopausal, Caucasian Women: Normal       T-score at or above -1 SD Osteopenia   T-score between -1 and -2.5 SD Osteoporosis T-score at or below -2.5 SD   RECOMMENDATION: 1. All patients should optimize calcium and vitamin D intake. 2. Consider FDA approved medical therapies in postmenopausal women and men aged 32 years and  older, based on the following: a. A hip or vertebral (clinical or morphometric) fracture b. T- score < or = -2.5 at the femoral neck or spine after appropriate evaluation to exclude secondary causes c. Low bone mass (T-score between -1.0 and -2.5 at the femoral neck or spine) and a 10 year probability of a hip fracture > or = 3% or a 10 year probability of a major osteoporosis-related fracture > or = 20% based on the [place]-adapted WHO algorithm d. Clinician judgment and/or patient preferences may indicate treatment for people with 10-year fracture probabilities above or below these levels      INTERVAL HISTORY:  Crystal Logan is here for a follow up of ight breast cancer  She was last seen by me on 04/04/2022 She presents to the clinic alone. Pt reports that her back and her lags hurt and she gets very stiff in the bed. Pt stated this has been going on for about two weeks. Pt states for pain she has taken Ibuprofen and Aleve and has used an heating pad. Pt also stated that some what its getting better.Pt states that the pain comes from the back and down to the legs. Pt  reports the pain is mostly in the lower back. Pt states only  when lying down she has the intense back pain. Pt stated she did not see her PCP for this pain. Pt has not notice any change since coming off Anastrozole.     All other systems were reviewed with the patient and are negative.  MEDICAL HISTORY:  Past Medical History:  Diagnosis Date   Breast cancer ([place]) 12/2016   right   History of radiation therapy 02/13/17- 03/20/17   Right Breast treated to 50 Gy in 25 fractions   Hypertension    states under control with med., has been on med. ~ 15 yr.   Personal history of radiation therapy 01/08/2017   Ended 02-01-2017    SURGICAL HISTORY: Past Surgical History:  Procedure Laterality Date   BREAST LUMPECTOMY Right 01/02/2017   BREAST LUMPECTOMY WITH RADIOACTIVE SEED AND SENTINEL LYMPH NODE BIOPSY Right  01/02/2017   Procedure: RIGHT BREAST LUMPECTOMY WITH RADIOACTIVE SEED AND RIGHT AXILLARY SENTINEL LYMPH NODE BIOPSY;  Surgeon: Alphonsa Overall, MD;  Location: [place];  Service: General;  Laterality: Right;   COLONOSCOPY  2018   RE-EXCISION OF BREAST LUMPECTOMY Right 01/18/2017   Procedure: RE-EXCISION OF BREAST LUMPECTOMY;  Surgeon: Alphonsa Overall, MD;  Location: [place];  Service: General;  Laterality: Right;   TONSILLECTOMY      I have reviewed the social history and family history with the patient and they are unchanged from previous note.  ALLERGIES:  is allergic to no known allergies.  MEDICATIONS:  Current Outpatient Medications  Medication Sig Dispense Refill   anastrozole (ARIMIDEX) 1 MG tablet Take 1 tablet (1 mg total) by mouth daily. 30 tablet 5   CANDESARTAN CILEXETIL PO Take 1 tablet by mouth. Pt not sure of dose     No current facility-administered medications for this visit.    PHYSICAL EXAMINATION: ECOG PERFORMANCE STATUS: 1 - Symptomatic but completely ambulatory  Vitals:   10/25/22 1002  BP: (!) 138/96  Pulse: 86  Resp: 18  Temp: 97.8 F (36.6 C)  SpO2: 98%   Wt Readings from Last 3 Encounters:  10/25/22 229 lb 3.2 oz (104 kg)  04/04/22 226 lb 9.6 oz (102.8 kg)  10/05/21 233 lb 14.4 oz (106.1 kg)     GENERAL:alert, no distress and comfortable SKIN: skin color, texture, turgor are normal, no rashes or significant lesions EYES: normal, Conjunctiva are pink and non-injected, sclera clear NECK: (-) supple, thyroid normal size, non-tender, without nodularity LYMPH:(-)   no palpable lymphadenopathy in the cervical, axillary  ABDOMEN:(-) abdomen soft, non-tender and normal bowel sounds Musculoskeletal:no cyanosis of digits and no clubbing  NEURO: alert & oriented x 3 with fluent speech, no focal motor/sensory deficits BREAST: RT Breast Lumpectomy, left Breast exam no palpable mass, Breast exam is benign     LABORATORY DATA:  I have reviewed  the data as listed    Latest Ref Rng & Units 10/25/2022    9:24 AM 04/04/2022    8:33 AM 10/05/2021    8:31 AM  CBC  WBC 4.0 - 10.5 K/uL 4.4  4.7  5.3   Hemoglobin 12.0 - 15.0 g/dL 12.9  12.1  12.6   Hematocrit 36.0 - 46.0 % 38.5  35.9  38.0   Platelets 150 - 400 K/uL 275  273  283         Latest Ref Rng & Units 10/25/2022    9:24 AM 04/04/2022    8:33 AM 10/05/2021    8:31 AM  CMP  Glucose 70 - 99 mg/dL 153  151  156   BUN 8 - 23 mg/dL 23  27  24   $ Creatinine 0.44 - 1.00 mg/dL 0.94  0.95  0.88   Sodium 135 - 145 mmol/L 137  138  134   Potassium 3.5 - 5.1 mmol/L 3.9  4.3  4.3   Chloride 98 - 111 mmol/L 102  100  99   CO2 22 - 32 mmol/L 25  30  26   $ Calcium 8.9 - 10.3 mg/dL 9.0  9.4  9.4   Total Protein 6.5 - 8.1 g/dL 7.4  7.3  7.4   Total Bilirubin 0.3 - 1.2 mg/dL 0.7  0.5  0.4   Alkaline Phos 38 - 126 U/L 56  73  65   AST 15 - 41 U/L 26  20  21   $ ALT 0 - 44 U/L 34  34  34       RADIOGRAPHIC STUDIES: I have personally reviewed the radiological images as listed and agreed with the findings in the report. No results found.    Orders Placed This Encounter  Procedures   MM Digital Screening    Standing Status:   Future    Standing Expiration Date:   10/25/2023    Order Specific Question:   Reason for Exam (SYMPTOM  OR DIAGNOSIS REQUIRED)    Answer:   screening    Order Specific Question:   Preferred imaging location?    Answer:   [facility]   All questions were answered. The patient knows to call the clinic with any problems, questions or concerns. No barriers to learning was detected. The total time spent in the appointment was 30 minutes.     Truitt Merle, MD 10/25/2022   Felicity Coyer, CMA, am acting as scribe for Truitt Merle, MD.   I have reviewed the above documentation for accuracy and completeness, and I agree with the above.

## 2022-10-25 ENCOUNTER — Encounter: Payer: Self-pay | Admitting: Hematology

## 2022-10-25 ENCOUNTER — Inpatient Hospital Stay: Payer: Medicare Other | Attending: Hematology

## 2022-10-25 ENCOUNTER — Inpatient Hospital Stay (HOSPITAL_BASED_OUTPATIENT_CLINIC_OR_DEPARTMENT_OTHER): Payer: Medicare Other | Admitting: Hematology

## 2022-10-25 VITALS — BP 138/96 | HR 86 | Temp 97.8°F | Resp 18 | Ht 64.0 in | Wt 229.2 lb

## 2022-10-25 DIAGNOSIS — Z923 Personal history of irradiation: Secondary | ICD-10-CM | POA: Insufficient documentation

## 2022-10-25 DIAGNOSIS — M2550 Pain in unspecified joint: Secondary | ICD-10-CM | POA: Insufficient documentation

## 2022-10-25 DIAGNOSIS — Z170 Estrogen receptor positive status [ER+]: Secondary | ICD-10-CM

## 2022-10-25 DIAGNOSIS — C50211 Malignant neoplasm of upper-inner quadrant of right female breast: Secondary | ICD-10-CM

## 2022-10-25 DIAGNOSIS — Z1231 Encounter for screening mammogram for malignant neoplasm of breast: Secondary | ICD-10-CM

## 2022-10-25 DIAGNOSIS — Z853 Personal history of malignant neoplasm of breast: Secondary | ICD-10-CM | POA: Diagnosis present

## 2022-10-25 LAB — CMP (CANCER CENTER ONLY)
ALT: 34 U/L (ref 0–44)
AST: 26 U/L (ref 15–41)
Albumin: 4 g/dL (ref 3.5–5.0)
Alkaline Phosphatase: 56 U/L (ref 38–126)
Anion gap: 10 (ref 5–15)
BUN: 23 mg/dL (ref 8–23)
CO2: 25 mmol/L (ref 22–32)
Calcium: 9 mg/dL (ref 8.9–10.3)
Chloride: 102 mmol/L (ref 98–111)
Creatinine: 0.94 mg/dL (ref 0.44–1.00)
GFR, Estimated: >60 mL/min (ref >60)
Glucose, Bld: 153 mg/dL — ABNORMAL HIGH (ref 70–99)
Potassium: 3.9 mmol/L (ref 3.5–5.1)
Sodium: 137 mmol/L (ref 135–145)
Total Bilirubin: 0.7 mg/dL (ref 0.3–1.2)
Total Protein: 7.4 g/dL (ref 6.5–8.1)

## 2022-10-25 LAB — CBC WITH DIFFERENTIAL (CANCER CENTER ONLY)
Abs Immature Granulocytes: 0.01 10*3/uL (ref 0.00–0.07)
Basophils Absolute: 0.1 10*3/uL (ref 0.0–0.1)
Basophils Relative: 2 %
Eosinophils Absolute: 0.2 10*3/uL (ref 0.0–0.5)
Eosinophils Relative: 4 %
HCT: 38.5 % (ref 36.0–46.0)
Hemoglobin: 12.9 g/dL (ref 12.0–15.0)
Immature Granulocytes: 0 %
Lymphocytes Relative: 29 %
Lymphs Abs: 1.3 10*3/uL (ref 0.7–4.0)
MCH: 30 pg (ref 26.0–34.0)
MCHC: 33.5 g/dL (ref 30.0–36.0)
MCV: 89.5 fL (ref 80.0–100.0)
Monocytes Absolute: 0.6 10*3/uL (ref 0.1–1.0)
Monocytes Relative: 13 %
Neutro Abs: 2.3 10*3/uL (ref 1.7–7.7)
Neutrophils Relative %: 52 %
Platelet Count: 275 10*3/uL (ref 150–400)
RBC: 4.3 MIL/uL (ref 3.87–5.11)
RDW: 13.4 % (ref 11.5–15.5)
WBC Count: 4.4 10*3/uL (ref 4.0–10.5)
nRBC: 0 % (ref 0.0–0.2)

## 2022-10-25 NOTE — Assessment & Plan Note (Addendum)
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  -She started antiestrogen therapy with letrozole in 04/2017. Due to weight gain, anxiety and trouble sleeping, I switched her to Anastrozole in 05/2018.  She will complete 5 years therapy at this months.  I encouraged her to consider additional 2 years of anastrozole, to reduce her risk of recurrence. She declines to take for longer; she will complete her current refill this month, then stop. -most recent mammogram on 03/30/22 was negative. -She is clinically doing well, denies any pain or other concerning signs, tolerating anastrozole well overall. Labs reviewed, WNL. Physical exam was unremarkable (breast exam deferred due to recent mammogram). No clinical concern for recurrence.  -she is 5 years out of her initial diagnosis, we will follow-up her annually.  Given the lobular histology, I again reviewed the risk of later recurrence.3

## 2022-12-17 ENCOUNTER — Other Ambulatory Visit: Payer: Self-pay

## 2022-12-19 ENCOUNTER — Encounter: Payer: Self-pay | Admitting: Hematology

## 2022-12-28 ENCOUNTER — Other Ambulatory Visit (INDEPENDENT_AMBULATORY_CARE_PROVIDER_SITE_OTHER): Payer: Medicare Other

## 2022-12-28 ENCOUNTER — Ambulatory Visit (INDEPENDENT_AMBULATORY_CARE_PROVIDER_SITE_OTHER): Payer: Medicare Other | Admitting: Orthopedic Surgery

## 2022-12-28 ENCOUNTER — Encounter: Payer: Self-pay | Admitting: Orthopedic Surgery

## 2022-12-28 DIAGNOSIS — M79605 Pain in left leg: Secondary | ICD-10-CM

## 2022-12-28 MED ORDER — MELOXICAM 15 MG PO TABS: ORAL_TABLET | ORAL | 0 refills | Status: DC

## 2022-12-28 NOTE — Progress Notes (Signed)
Office Visit Note   Patient: Crystal Logan           Date of Birth: [birth year]           MRN: [id number] Visit Date: 12/28/2022 Requested by: Ralene Ok, MD [id number] [place],  [place] [address] PCP: Ralene Ok, MD  Subjective: Chief Complaint  Patient presents with   Left Hip - Pain    HPI: Crystal Logan is a 72 y.o. female who presents to the office reporting left hip and leg pain and low back pain.  This has been going on for several months.  Patient is retired from fairly physical work.  Takes occasional Aleve for her symptoms.  She also describes that she is having trouble crossing her left leg across her right knee but has no trouble crossing the right leg over the left knee.  Denies much in the way of groin pain or numbness and tingling.  She is here with her husband..                ROS: All systems reviewed are negative as they relate to the chief complaint within the history of present illness.  Patient denies fevers or chills.  Assessment & Plan: Visit Diagnoses:  1. Pain in left leg     Plan: Impression is low back pain which looks to be the major culprit.  I think she may also have a little bit of early mild arthritis without groin pain.  This likely restricting some of her hip range of motion.  Plan at this time is a trial of Mobic for 2 weeks.  We talked about MRI scanning of her spine with injections to follow as well as possible diagnostic and therapeutic injection into the hip joint itself but she wants to hold off on those interventions.  If the Mobic does not help she will let me know when she comes back with her husband in a month and we can proceed from there.  Follow-Up Instructions: No follow-ups on file.   Orders:  Orders Placed This Encounter  Procedures   XR HIP UNILAT W OR W/O PELVIS 2-3 VIEWS LEFT   XR Lumbar Spine 2-3 Views   Meds ordered this encounter  Medications   meloxicam (MOBIC) 15 MG tablet    Sig: 1 po q d x 2 weeks then  q d prn    Dispense:  30 tablet    Refill:  0      Procedures: No procedures performed   Clinical Data: No additional findings.  Objective: Vital Signs: There were no vitals taken for this visit.  Physical Exam:  Constitutional: Patient appears well-developed HEENT:  Head: Normocephalic Eyes:EOM are normal Neck: Normal range of motion Cardiovascular: Normal rate Pulmonary/chest: Effort normal Neurologic: Patient is alert Skin: Skin is warm Psychiatric: Patient has normal mood and affect  Ortho Exam: Ortho exam demonstrates full active and passive range of motion of the knees and ankles on both sides.  Pedal pulses palpable.  No nerve root tension signs.  No paresthesias L1 S1 bilaterally.  Reflexes symmetric 0 1+ out of 4 bilateral patella and Achilles.  No discrete trochanteric tenderness.  Does have mild pain with forward lateral bending.  Specialty Comments:  No specialty comments available.  Imaging: XR HIP UNILAT W OR W/O PELVIS 2-3 VIEWS LEFT  Result Date: 12/28/2022 AP pelvis lateral left hip reviewed.  Mild degenerative arthritis with mild joint space narrowing noted in the left hip.  No acute fracture.  Some calcific nodules present within the pericapsular gluteal musculature.  XR Lumbar Spine 2-3 Views  Result Date: 12/28/2022 AP lateral radiographs lumbar spine reviewed.  Significant degenerative disc disease is present at L3-4 L4-5 and L5-S1.  Moderate to severe facet arthritis is present at these levels.  No compression fractures.    PMFS History: Patient Active Problem List   Diagnosis Date Noted   Malignant neoplasm of upper-inner quadrant of right breast in female, estrogen receptor positive 11/30/2016   Past Medical History:  Diagnosis Date   Breast cancer 12/2016   right   History of radiation therapy 02/13/17- 03/20/17   Right Breast treated to 50 Gy in 25 fractions   Hypertension    states under control with med., has been on med. ~ 15 yr.    Personal history of radiation therapy 01/08/2017   Ended 02-01-2017    Family History  Problem Relation Age of Onset   Colon cancer Neg Hx    Colon polyps Neg Hx    Esophageal cancer Neg Hx    Rectal cancer Neg Hx    Stomach cancer Neg Hx     Past Surgical History:  Procedure Laterality Date   BREAST LUMPECTOMY Right 01/02/2017   BREAST LUMPECTOMY WITH RADIOACTIVE SEED AND SENTINEL LYMPH NODE BIOPSY Right 01/02/2017   Procedure: RIGHT BREAST LUMPECTOMY WITH RADIOACTIVE SEED AND RIGHT AXILLARY SENTINEL LYMPH NODE BIOPSY;  Surgeon: Ovidio Kin, MD;  Location: [facility];  Service: General;  Laterality: Right;   COLONOSCOPY  2018   RE-EXCISION OF BREAST LUMPECTOMY Right 01/18/2017   Procedure: RE-EXCISION OF BREAST LUMPECTOMY;  Surgeon: Ovidio Kin, MD;  Location: [facility] OR;  Service: General;  Laterality: Right;   TONSILLECTOMY     Social History   Occupational History   Not on file  Tobacco Use   Smoking status: Former    Packs/day: 0.25    Years: 47.00    Additional pack years: 0.00    Total pack years: 11.75    Types: Cigarettes    Quit date: 2018    Years since quitting: 6.3   Smokeless tobacco: Never  Vaping Use   Vaping Use: Never used  Substance and Sexual Activity   Alcohol use: Yes    Comment: 1-2 times per year   Drug use: No   Sexual activity: Not on file

## 2023-01-25 ENCOUNTER — Ambulatory Visit: Payer: Medicare Other | Admitting: Orthopedic Surgery

## 2023-02-12 ENCOUNTER — Inpatient Hospital Stay: Admission: RE | Admit: 2023-02-12 | Payer: Medicare Other | Source: Ambulatory Visit

## 2023-04-01 ENCOUNTER — Ambulatory Visit
Admission: RE | Admit: 2023-04-01 | Discharge: 2023-04-01 | Disposition: A | Discharge status: Home / Self Care | Payer: Medicare Other | Source: Ambulatory Visit | Attending: Hematology | Admitting: Hematology

## 2023-04-01 DIAGNOSIS — Z1231 Encounter for screening mammogram for malignant neoplasm of breast: Secondary | ICD-10-CM

## 2023-04-12 ENCOUNTER — Other Ambulatory Visit: Payer: Self-pay | Admitting: Nurse Practitioner

## 2023-04-12 NOTE — Progress Notes (Signed)
Negative mammogram. Repeat study in one year

## 2023-07-26 ENCOUNTER — Emergency Department (HOSPITAL_COMMUNITY)
Admission: EM | Admit: 2023-07-26 | Discharge: 2023-07-27 | Disposition: A | Discharge status: Home / Self Care | Payer: Medicare Other | Attending: Emergency Medicine | Admitting: Emergency Medicine

## 2023-07-26 ENCOUNTER — Encounter (HOSPITAL_COMMUNITY): Payer: Self-pay | Admitting: Emergency Medicine

## 2023-07-26 ENCOUNTER — Other Ambulatory Visit: Payer: Self-pay

## 2023-07-26 DIAGNOSIS — R519 Headache, unspecified: Secondary | ICD-10-CM | POA: Diagnosis not present

## 2023-07-26 DIAGNOSIS — N390 Urinary tract infection, site not specified: Secondary | ICD-10-CM | POA: Diagnosis not present

## 2023-07-26 DIAGNOSIS — I10 Essential (primary) hypertension: Secondary | ICD-10-CM | POA: Insufficient documentation

## 2023-07-26 DIAGNOSIS — R42 Dizziness and giddiness: Secondary | ICD-10-CM | POA: Diagnosis present

## 2023-07-26 LAB — CBC
HCT: 41.8 % (ref 36.0–46.0)
Hemoglobin: 14.3 g/dL (ref 12.0–15.0)
MCH: 30.4 pg (ref 26.0–34.0)
MCHC: 34.2 g/dL (ref 30.0–36.0)
MCV: 88.9 fL (ref 80.0–100.0)
Platelets: 294 10*3/uL (ref 150–400)
RBC: 4.7 MIL/uL (ref 3.87–5.11)
RDW: 12.4 % (ref 11.5–15.5)
WBC: 5.9 10*3/uL (ref 4.0–10.5)
nRBC: 0 % (ref 0.0–0.2)

## 2023-07-26 LAB — COMPREHENSIVE METABOLIC PANEL
ALT: 42 U/L (ref 0–44)
AST: 30 U/L (ref 15–41)
Albumin: 4.1 g/dL (ref 3.5–5.0)
Alkaline Phosphatase: 50 U/L (ref 38–126)
Anion gap: 14 (ref 5–15)
BUN: 19 mg/dL (ref 8–23)
CO2: 23 mmol/L (ref 22–32)
Calcium: 9.7 mg/dL (ref 8.9–10.3)
Chloride: 95 mmol/L — ABNORMAL LOW (ref 98–111)
Creatinine, Ser: 0.73 mg/dL (ref 0.44–1.00)
GFR, Estimated: >60 mL/min (ref >60)
Glucose, Bld: 118 mg/dL — ABNORMAL HIGH (ref 70–99)
Potassium: 3.4 mmol/L — ABNORMAL LOW (ref 3.5–5.1)
Sodium: 132 mmol/L — ABNORMAL LOW (ref 135–145)
Total Bilirubin: 0.7 mg/dL (ref <1.2)
Total Protein: 7.5 g/dL (ref 6.5–8.1)

## 2023-07-26 NOTE — ED Provider Triage Note (Signed)
Emergency Medicine Provider Triage Evaluation Note  Crystal Logan , a 72 y.o. female  was evaluated in triage.  Pt complains of headache and weakness.  Pt tells interpreter that she had a head ct yesterday.  Pt diagnosed with a sinus polyp  Review of Systems  Positive: weakness  Negative: fever   Physical Exam  There were no vitals taken for this visit. Gen:   Awake, no distress   Resp:  Normal effort  MSK:   Moves extremities without difficulty  Other:    Medical Decision Making  Medically screening exam initiated at 1:31 PM.  Appropriate orders placed.  Keagan C Sandru Rus was informed that the remainder of the evaluation will be completed by another provider, this initial triage assessment does not replace that evaluation, and the importance of remaining in the ED until their evaluation is complete.     Elson Areas, [place] 07/26/23 1334

## 2023-07-26 NOTE — ED Triage Notes (Signed)
Pt BIB [facility] from home with reports headache and not feeling well. Pt was seen PCP had imaging done and was told it was normal. Denies extremity weakness.

## 2023-07-27 ENCOUNTER — Emergency Department (HOSPITAL_COMMUNITY): Payer: Medicare Other

## 2023-07-27 LAB — URINALYSIS, ROUTINE W REFLEX MICROSCOPIC
Bilirubin Urine: NEGATIVE
Glucose, UA: NEGATIVE mg/dL
Hgb urine dipstick: NEGATIVE
Ketones, ur: 5 mg/dL — AB
Nitrite: NEGATIVE
Protein, ur: NEGATIVE mg/dL
Specific Gravity, Urine: 1.017 (ref 1.005–1.030)
WBC, UA: >50 WBC/hpf (ref 0–5)
pH: 5 (ref 5.0–8.0)

## 2023-07-27 MED ORDER — MECLIZINE HCL 25 MG PO TABS
25.0000 mg | ORAL_TABLET | Freq: Once | ORAL | Status: AC
  Administered 2023-07-27: 25 mg via ORAL
  Filled 2023-07-27: qty 1

## 2023-07-27 MED ORDER — CEPHALEXIN 250 MG PO CAPS
500.0000 mg | ORAL_CAPSULE | Freq: Once | ORAL | Status: AC
  Administered 2023-07-27: 500 mg via ORAL
  Filled 2023-07-27: qty 2

## 2023-07-27 MED ORDER — CEPHALEXIN 500 MG PO CAPS: 500.0000 mg | ORAL_CAPSULE | Freq: Two times a day (BID) | ORAL | 0 refills | Status: AC

## 2023-07-27 NOTE — Discharge Instructions (Addendum)
Follow up with your primary care provider. Return to the ER for new or worsening symptoms.   Your urine shows you may have a urinary tract infection. Please take antibiotics as prescribed. Hopefully this will help with your symptoms.

## 2023-07-27 NOTE — ED Provider Notes (Signed)
[place] EMERGENCY DEPARTMENT AT [facility] Provider Note   CSN: [id number] Arrival date & time: 07/26/23  1332     History  Chief Complaint  Patient presents with   Headache    Crystal Logan is a 72 y.o. female.  72 yo female presents with complaint of feeling dizzy x 1 week (not described as light headed, room spinning, on a boat), worse with changes in position/standing, associated with nausea, vomiting, loss of appetite, global headache. Here with friend who reports patient has been caring for her bedridden spouse, wonders if this is contributing to her symptoms. Patient went to PCP this week, had a CT scan that showed a nasal poly and she was referred to ENT.        Home Medications Prior to Admission medications   Medication Sig Start Date End Date Taking? Authorizing Provider  cephALEXin (KEFLEX) 500 MG capsule Take 1 capsule (500 mg total) by mouth 2 (two) times daily for 5 days. 07/27/23 08/01/23 Yes Jeannie Fend, PA-C  anastrozole (ARIMIDEX) 1 MG tablet Take 1 tablet (1 mg total) by mouth daily. 10/05/21   Malachy Mood, MD  CANDESARTAN CILEXETIL PO Take 1 tablet by mouth. Pt not sure of dose    [provider]  meloxicam (MOBIC) 15 MG tablet 1 po q d x 2 weeks then q d prn 12/28/22   Cammy Copa, MD      Allergies    No known allergies    Review of Systems   Review of Systems Negative except as per HPI Physical Exam Updated Vital Signs BP 134/72 (BP Location: Right Arm)   Pulse 66   Temp 97.8 F (36.6 C) (Oral)   Resp 16   Ht 5\' 4"  (1.626 m)   Wt 105 kg   SpO2 100%   BMI 39.73 kg/m  Physical Exam Vitals and nursing note reviewed.  Constitutional:      General: She is not in acute distress.    Appearance: She is well-developed. She is not diaphoretic.  HENT:     Head: Normocephalic and atraumatic.     Mouth/Throat:     Mouth: Mucous membranes are moist.  Eyes:     Extraocular Movements: Extraocular movements  intact.     Pupils: Pupils are equal, round, and reactive to light.  Pulmonary:     Effort: Pulmonary effort is normal.  Musculoskeletal:        General: Normal range of motion.     Cervical back: Neck supple.  Skin:    General: Skin is warm and dry.     Findings: No erythema or rash.  Neurological:     Mental Status: She is alert and oriented to person, place, and time.     GCS: GCS eye subscore is 4. GCS verbal subscore is 5. GCS motor subscore is 6.     Cranial Nerves: No cranial nerve deficit or facial asymmetry.     Sensory: No sensory deficit.     Coordination: Coordination normal.  Psychiatric:        Behavior: Behavior normal.     ED Results / Procedures / Treatments   Labs (all labs ordered are listed, but only abnormal results are displayed) Labs Reviewed  COMPREHENSIVE METABOLIC PANEL - Abnormal; Notable for the following components:      Result Value   Sodium 132 (*)    Potassium 3.4 (*)    Chloride 95 (*)    Glucose, Bld 118 (*)  All other components within normal limits  URINALYSIS, ROUTINE W REFLEX MICROSCOPIC - Abnormal; Notable for the following components:   APPearance HAZY (*)    Ketones, ur 5 (*)    Leukocytes,Ua LARGE (*)    Bacteria, UA RARE (*)    Non Squamous Epithelial 0-5 (*)    All other components within normal limits  CBC    EKG None  Radiology MR BRAIN WO CONTRAST  Result Date: 07/27/2023 CLINICAL DATA:  Neuro deficit, acute, stroke suspected. Headache and dizziness with weakness. EXAM: MRI HEAD WITHOUT CONTRAST TECHNIQUE: Multiplanar, multiecho pulse sequences of the brain and surrounding structures were obtained without intravenous contrast. COMPARISON:  None Available. FINDINGS: Brain: No acute infarction, hemorrhage, hydrocephalus, extra-axial collection or mass lesion. Small FLAIR hyperintensities scattered in the cerebral white matter, less than commonly seen in healthy patients of the same age and from nonspecific remote insults  (usually microvascular ischemic). Vascular: Normal flow voids. Skull and upper cervical spine: Normal marrow signal. Sinuses/Orbits: Negative. IMPRESSION: Unremarkable brain MRI for age.  No infarct. Electronically Signed   By: Tiburcio Pea M.D.   On: 07/27/2023 04:07    Procedures Procedures    Medications Ordered in ED Medications  meclizine (ANTIVERT) tablet 25 mg (25 mg Oral Given 07/27/23 0111)  cephALEXin (KEFLEX) capsule 500 mg (500 mg Oral Given 07/27/23 0510)    ED Course/ Medical Decision Making/ A&P                                 Medical Decision Making Amount and/or Complexity of Data Reviewed Labs: ordered. Radiology: ordered.  Risk Prescription drug management.   This patient presents to the ED for concern of headache, dizziness, urinary frequency, this involves an extensive number of treatment options, and is a complaint that carries with it a high risk of complications and morbidity.  The differential diagnosis includes TIA, CVA, mass, UTI   Co morbidities that complicate the patient evaluation  HTN, breast CA   Additional history obtained:  Additional history obtained from friend at bedside who contributes to history as above  External records from outside source obtained and reviewed including prior labs on file for comparison    Lab Tests:  I Ordered, and personally interpreted labs.  The pertinent results include:  UA concerning for UTI. CBC and CMP without significant findings.    Imaging Studies ordered:  I ordered imaging studies including MRI brain  I independently visualized and interpreted imaging which showed no acute abnormality  I agree with the radiologist interpretation   Problem List / ED Course / Critical interventions / Medication management  72 year old female with dizziness, worse with changes in position, and headache. Work up reassuring including MRI, pending UA. Patient reports urinary frequency, agreeable to wait for UA.  Found to have UTI, will treat with Keflex.  I ordered medication including keflex, meclizine  for UTI, dizziness  Reevaluation of the patient after these medicines showed that the patient improved I have reviewed the patients home medicines and have made adjustments as needed   Social Determinants of Health:  Has PCP   Test / Admission - Considered:  Stable for dc with follow up with PCP         Final Clinical Impression(s) / ED Diagnoses Final diagnoses:  Acute nonintractable headache, unspecified headache type  Dizziness  Urinary tract infection in female    Rx / DC Orders ED Discharge  Orders          Ordered    cephALEXin (KEFLEX) 500 MG capsule  2 times daily        07/27/23 0507              Jeannie Fend, PA-C 07/27/23 [id number]    Gilda Crease, MD 07/27/23 [phone]

## 2023-07-27 NOTE — ED Notes (Signed)
Pt aware of need for urine sample.  

## 2023-10-24 ENCOUNTER — Other Ambulatory Visit: Payer: Self-pay | Admitting: Nurse Practitioner

## 2023-10-24 DIAGNOSIS — C50211 Malignant neoplasm of upper-inner quadrant of right female breast: Secondary | ICD-10-CM

## 2023-10-24 NOTE — Progress Notes (Addendum)
Patient Care Team: Ralene Ok, MD as PCP - General (Internal Medicine) Ovidio Kin, MD as Consulting Physician (General Surgery) Malachy Mood, MD as Consulting Physician (Hematology) Lonie Peak, MD as Attending Physician (Radiation Oncology) Loa Socks, NP as Nurse Practitioner (Hematology and Oncology)   CHIEF COMPLAINT: Follow up right breast cancer   Oncology History Overview Note  Cancer Staging Malignant neoplasm of upper-inner quadrant of right breast in female, estrogen receptor positive [facility]) Staging form: Breast, AJCC 8th Edition - Clinical stage from 11/27/2016: Stage IA (cT1c, cN0, cM0, G2, ER: Positive, PR: Positive, HER2: Negative) - Signed by Malachy Mood, MD on 12/05/2016     Malignant neoplasm of upper-inner quadrant of right breast in female, estrogen receptor positive (HCC)  11/16/2016 Mammogram   Diagnostic mammogram and US showed suspicious asymmetry/distortion within the inner right breast, 3 o'clock axis region, at middle depth, without definite sonographic Correlated, axilla (-)   11/27/2016 Receptors her2   ER 90%+ PR 40%+ HER2 - Ki67 5%   11/27/2016 Initial Biopsy   Breast, right, needle core biopsy, 3:00 o'clock - INVASIVE LOBULAR CARCINOMA, G1-2   11/27/2016 Initial Diagnosis   Malignant neoplasm of upper-inner quadrant of right breast in female, estrogen receptor positive (HCC)   12/10/2016 Imaging   MR Breast Bilateral IMPRESSION: 1. Post biopsy changes and associated non masslike enhancement in the medial aspect of the right breast. 2. Left breast is negative. 3. No MRI evidence for adenopathy.   12/26/2016 Procedure   Colonoscopy  - Three 3 to 6 mm polyps in the descending colon, removed with a cold snare. Resected and retrieved. - The examination was otherwise normal on direct and retroflexion views.   12/26/2016 Pathology Results   Diagnosis Surgical [P], descending, polyps (3) -TUBULAR ADENOMAS. -NO HIGH GRADE DYSPLASIA  OR MALIGNANCY IDENTIFIED.   01/02/2017 Surgery   Right Breast lumpectomy with sentinel lymph node biopsy performed by Dr. Ezzard Standing.   01/02/2017 Pathology Results   Diagnosis  1. Breast, lumpectomy, Right - INVASIVE LOBULAR CARCINOMA, 2.8 CM. - INVASIVE CARCINOMA BROADLY INVOLVES INFERIOR MARGIN. - PREVIOUS BIOPSY SITE. - FOCAL LYMPH VASCULAR INVOLVEMENT BY TUMOR. 2. Lymph node, sentinel, biopsy, Right Axillary - METASTATIC CARCINOMA IN ONE LYMPH NODE WITH EXTRANODAL EXTENSION (1/1).   01/18/2017 Surgery   RE-EXCISION OF BREAST LUMPECTOMY by Dr. Ezzard Standing    01/18/2017 Pathology Results   Diagnosis 01/18/17 Breast, excision, Right Inferior Margin - ATYPICAL DUCTAL HYPERPLASIA. - FIBROCYSTIC CHANGES WITH CALCIFICATIONS. - VASCULAR CALCIFICATIONS. - SEE COMMENT. Microscopic Comment The surgical resection margin(s) of the specimen were inked and microscopically evaluated     - 03/20/2017 Radiation Therapy   Radiation by Dr. Basilio Cairo    04/2017 -  Anti-estrogen oral therapy   Letrozole daily started in 04/2017. Due to anxiety, weight gain and troule sleeping I swithced her to Anastrozole in 05/2018.    03/24/2018 Imaging   03/24/2018 Bone Density ASSESSMENT: The BMD measured at Femur Neck Left is 0.944 g/cm2 with a T-score of -0.7. This patient is considered normal according to World Health Organization [facility]) criteria.   03/24/2018 Mammogram   03/24/2018 Mammogram IMPRESSION: Expected surgical changes in the medial central right breast. No mammographic evidence of malignancy in the bilateral breasts.   03/24/2018 Imaging   DEXA ASSESSMENT: The BMD measured at Femur Neck Left is 0.944 g/cm2 with a T-score of -0.7. This patient is considered normal according to World Health Organization [facility]) criteria.   The scan quality is good. L-3, L-4 were excluded due to  degenerative changes.   Site Region Measured Date Measured Age YA BMD Significant CHANGE T-score AP Spine  L1-L2      3/24/2018    67.2         -0.5    1.110 g/cm2   DualFemur Neck Left 3/24/2018    67.2         -0.7    0.944 g/cm2   World Health Organization [facility]) criteria for post-menopausal, Caucasian Women: Normal       T-score at or above -1 SD Osteopenia   T-score between -1 and -2.5 SD Osteoporosis T-score at or below -2.5 SD   RECOMMENDATION: 1. All patients should optimize calcium and vitamin D intake. 2. Consider FDA approved medical therapies in postmenopausal women and men aged 81 years and older, based on the following: a. A hip or vertebral (clinical or morphometric) fracture b. T- score < or = -2.5 at the femoral neck or spine after appropriate evaluation to exclude secondary causes c. Low bone mass (T-score between -1.0 and -2.5 at the femoral neck or spine) and a 10 year probability of a hip fracture > or = 3% or a 10 year probability of a major osteoporosis-related fracture > or = 20% based on the [place]-adapted WHO algorithm d. Clinician judgment and/or patient preferences may indicate treatment for people with 10-year fracture probabilities above or below these levels      CURRENT THERAPY: Antiestrogen therapy, starting 04/10/17 - 03/2022, completed 5 years  INTERVAL HISTORY Ms. Crystal Logan returns for follow up as scheduled. Last seen by Dr. Mosetta Putt 10/25/22.  Mammogram 04/2023 was benign.  Her husband was on bedrest for 4 months before he passed, she was taking care of him which left her drained emotionally and physically with some more arthritic pain, and low appetite but she is recovering.  Now packing to move to another residence in [place].  Sees a PCP.  Denies breast concerns or changes such as new lump/mass, nipple discharge or inversion, or skin change.  ROS  All other systems reviewed and negative  Past Medical History:  Diagnosis Date   Breast cancer (HCC) 12/2016   right   History of radiation therapy 02/13/17- 03/20/17   Right Breast treated to 50 Gy in 25 fractions    Hypertension    states under control with med., has been on med. ~ 15 yr.   Personal history of radiation therapy 01/08/2017   Ended 02-01-2017     Past Surgical History:  Procedure Laterality Date   BREAST LUMPECTOMY Right 01/02/2017   BREAST LUMPECTOMY WITH RADIOACTIVE SEED AND SENTINEL LYMPH NODE BIOPSY Right 01/02/2017   Procedure: RIGHT BREAST LUMPECTOMY WITH RADIOACTIVE SEED AND RIGHT AXILLARY SENTINEL LYMPH NODE BIOPSY;  Surgeon: Ovidio Kin, MD;  Location: [facility];  Service: General;  Laterality: Right;   COLONOSCOPY  2018   RE-EXCISION OF BREAST LUMPECTOMY Right 01/18/2017   Procedure: RE-EXCISION OF BREAST LUMPECTOMY;  Surgeon: Ovidio Kin, MD;  Location: [facility];  Service: General;  Laterality: Right;   TONSILLECTOMY       Outpatient Encounter Medications as of 10/28/2023  Medication Sig   candesartan (ATACAND) 32 MG tablet Take 32 mg by mouth daily.   latanoprost (XALATAN) 0.005 % ophthalmic solution SMARTSIG:In Eye(s)   metFORMIN (GLUCOPHAGE) 500 MG tablet Take 500 mg by mouth 2 (two) times daily.   sertraline (ZOLOFT) 50 MG tablet Take 50 mg by mouth daily.   [DISCONTINUED] anastrozole (ARIMIDEX) 1 MG tablet Take 1 tablet (1 mg  total) by mouth daily.   [DISCONTINUED] CANDESARTAN CILEXETIL PO Take 1 tablet by mouth. Pt not sure of dose (Patient not taking: Reported on 10/28/2023)   [DISCONTINUED] meloxicam (MOBIC) 15 MG tablet 1 po q d x 2 weeks then q d prn (Patient not taking: Reported on 10/28/2023)   No facility-administered encounter medications on file as of 10/28/2023.     Today's Vitals   10/28/23 0912 10/28/23 0917  BP: 131/73   Pulse: 79   Resp: 17   Temp: 97.8 F (36.6 C)   TempSrc: Temporal   SpO2: 98%   Weight: 212 lb 14.4 oz (96.6 kg)   PainSc:  0-No pain   Body mass index is 36.54 kg/m.   PHYSICAL EXAM GENERAL:alert, no distress and comfortable SKIN: no rash  EYES: sclera clear NECK: without mass LYMPH:  no palpable cervical or  supraclavicular lymphadenopathy  LUNGS:  normal breathing effort HEART: no lower extremity edema ABDOMEN: abdomen soft, non-tender and normal bowel sounds NEURO: alert & oriented x 3 with fluent speech, no focal motor/sensory deficits Breast exam: No nipple discharge or inversion.  S/p right lumpectomy, incisions completely healed with some distortion and scar tissue.  No palpable mass or nodularity in either breast or axilla that I could appreciate   CBC    Component Value Date/Time   WBC 4.6 10/28/2023 0841   WBC 5.9 07/26/2023 1338   RBC 4.19 10/28/2023 0841   HGB 12.8 10/28/2023 0841   HGB 13.2 01/10/2017 1242   HCT 38.0 10/28/2023 0841   HCT 39.6 01/10/2017 1242   PLT 293 10/28/2023 0841   PLT 284 01/10/2017 1242   MCV 90.7 10/28/2023 0841   MCV 91.9 01/10/2017 1242   MCH 30.5 10/28/2023 0841   MCHC 33.7 10/28/2023 0841   RDW 13.0 10/28/2023 0841   RDW 13.2 01/10/2017 1242   LYMPHSABS 1.3 10/28/2023 0841   LYMPHSABS 3.2 01/10/2017 1242   MONOABS 0.6 10/28/2023 0841   MONOABS 0.7 01/10/2017 1242   EOSABS 0.2 10/28/2023 0841   EOSABS 0.2 01/10/2017 1242   BASOSABS 0.1 10/28/2023 0841   BASOSABS 0.1 01/10/2017 1242     CMP     Component Value Date/Time   NA 133 (L) 10/28/2023 0841   NA 139 01/10/2017 1242   K 3.7 10/28/2023 0841   K 4.2 01/10/2017 1242   CL 99 10/28/2023 0841   CO2 27 10/28/2023 0841   CO2 26 01/10/2017 1242   GLUCOSE 147 (H) 10/28/2023 0841   GLUCOSE 104 01/10/2017 1242   BUN 29 (H) 10/28/2023 0841   BUN 12.0 01/10/2017 1242   CREATININE 0.78 10/28/2023 0841   CREATININE 0.8 01/10/2017 1242   CALCIUM 9.6 10/28/2023 0841   CALCIUM 9.6 01/10/2017 1242   PROT 7.1 10/28/2023 0841   PROT 7.2 01/10/2017 1242   ALBUMIN 4.3 10/28/2023 0841   ALBUMIN 3.9 01/10/2017 1242   AST 18 10/28/2023 0841   AST 12 01/10/2017 1242   ALT 23 10/28/2023 0841   ALT 28 01/10/2017 1242   ALKPHOS 57 10/28/2023 0841   ALKPHOS 89 01/10/2017 1242   BILITOT 0.4  10/28/2023 0841   BILITOT 0.38 01/10/2017 1242   GFRNONAA >60 10/28/2023 0841   GFRAA >60 04/06/2020 0803     ASSESSMENT & PLAN:Aubriella C Sandru Rus is a 73 y.o. female with    Malignant neoplasm of upper-inner quadrant of right breast in female, estrogen receptor positive, invasive lobular carcinoma, pT2N0M0, stage IA, grade 1, ER+ / PR+ / HER-2  negative -diagnosed in 11/2016, s/p right breast lumpectomy and re-excision and adjuvant radiation.  -She started antiestrogen therapy with letrozole in 04/2017. Due to weight gain, anxiety and trouble sleeping, she switched to Anastrozole in 05/2018.  She completed 5 years therapy in July 2023.  She declined extended AI. -Last mammo 04/01/23 was benign -Ms. Rus is clinically doing well.  Exam is benign, labs are unremarkable.  Overall no clinical concern for recurrence.  She is 7 years out from diagnosis, the recurrence risk is decreased.   -Due to the lobular histology I plan to follow her annually up to 10 years out -Continue age-appropriate health with PCP -Follow-up in 1 year, or sooner if needed     PLAN: -Last mammo and today's labs reviewed -Mammo in 03/2024, ordered -Offered emotional support, she declined SW referral for resources -Lab and annual surveillance visit in 1 year, or sooner if needed   Orders Placed This Encounter  Procedures   MM 3D SCREENING MAMMOGRAM BILATERAL BREAST    Standing Status:   Future    Expected Date:   04/01/2024    Expiration Date:   10/27/2024    Reason for Exam (SYMPTOM  OR DIAGNOSIS REQUIRED):   R breast cancer 2018    Preferred imaging location?:   [facility]      All questions were answered. The patient knows to call the clinic with any problems, questions or concerns. No barriers to learning were detected.   Santiago Glad, NP-C 10/28/2023

## 2023-10-28 ENCOUNTER — Inpatient Hospital Stay (HOSPITAL_BASED_OUTPATIENT_CLINIC_OR_DEPARTMENT_OTHER): Payer: Medicare Other | Admitting: Nurse Practitioner

## 2023-10-28 ENCOUNTER — Inpatient Hospital Stay: Payer: Medicare Other | Attending: Nurse Practitioner

## 2023-10-28 ENCOUNTER — Encounter: Payer: Self-pay | Admitting: Nurse Practitioner

## 2023-10-28 VITALS — BP 131/73 | HR 79 | Temp 97.8°F | Resp 17 | Wt 212.9 lb

## 2023-10-28 DIAGNOSIS — Z87891 Personal history of nicotine dependence: Secondary | ICD-10-CM | POA: Diagnosis not present

## 2023-10-28 DIAGNOSIS — Z1231 Encounter for screening mammogram for malignant neoplasm of breast: Secondary | ICD-10-CM | POA: Diagnosis not present

## 2023-10-28 DIAGNOSIS — Z170 Estrogen receptor positive status [ER+]: Secondary | ICD-10-CM

## 2023-10-28 DIAGNOSIS — C50211 Malignant neoplasm of upper-inner quadrant of right female breast: Secondary | ICD-10-CM | POA: Diagnosis not present

## 2023-10-28 DIAGNOSIS — Z853 Personal history of malignant neoplasm of breast: Secondary | ICD-10-CM | POA: Diagnosis present

## 2023-10-28 DIAGNOSIS — Z923 Personal history of irradiation: Secondary | ICD-10-CM | POA: Insufficient documentation

## 2023-10-28 LAB — CBC WITH DIFFERENTIAL (CANCER CENTER ONLY)
Abs Immature Granulocytes: 0.01 10*3/uL (ref 0.00–0.07)
Basophils Absolute: 0.1 10*3/uL (ref 0.0–0.1)
Basophils Relative: 2 %
Eosinophils Absolute: 0.2 10*3/uL (ref 0.0–0.5)
Eosinophils Relative: 5 %
HCT: 38 % (ref 36.0–46.0)
Hemoglobin: 12.8 g/dL (ref 12.0–15.0)
Immature Granulocytes: 0 %
Lymphocytes Relative: 27 %
Lymphs Abs: 1.3 10*3/uL (ref 0.7–4.0)
MCH: 30.5 pg (ref 26.0–34.0)
MCHC: 33.7 g/dL (ref 30.0–36.0)
MCV: 90.7 fL (ref 80.0–100.0)
Monocytes Absolute: 0.6 10*3/uL (ref 0.1–1.0)
Monocytes Relative: 14 %
Neutro Abs: 2.4 10*3/uL (ref 1.7–7.7)
Neutrophils Relative %: 52 %
Platelet Count: 293 10*3/uL (ref 150–400)
RBC: 4.19 MIL/uL (ref 3.87–5.11)
RDW: 13 % (ref 11.5–15.5)
WBC Count: 4.6 10*3/uL (ref 4.0–10.5)
nRBC: 0 % (ref 0.0–0.2)

## 2023-10-28 LAB — CMP (CANCER CENTER ONLY)
ALT: 23 U/L (ref 0–44)
AST: 18 U/L (ref 15–41)
Albumin: 4.3 g/dL (ref 3.5–5.0)
Alkaline Phosphatase: 57 U/L (ref 38–126)
Anion gap: 7 (ref 5–15)
BUN: 29 mg/dL — ABNORMAL HIGH (ref 8–23)
CO2: 27 mmol/L (ref 22–32)
Calcium: 9.6 mg/dL (ref 8.9–10.3)
Chloride: 99 mmol/L (ref 98–111)
Creatinine: 0.78 mg/dL (ref 0.44–1.00)
GFR, Estimated: >60 mL/min (ref >60)
Glucose, Bld: 147 mg/dL — ABNORMAL HIGH (ref 70–99)
Potassium: 3.7 mmol/L (ref 3.5–5.1)
Sodium: 133 mmol/L — ABNORMAL LOW (ref 135–145)
Total Bilirubin: 0.4 mg/dL (ref 0.0–1.2)
Total Protein: 7.1 g/dL (ref 6.5–8.1)

## 2024-03-04 ENCOUNTER — Encounter: Payer: Self-pay | Admitting: Emergency Medicine

## 2024-03-09 ENCOUNTER — Encounter: Payer: Self-pay | Admitting: Cardiovascular Disease

## 2024-03-09 ENCOUNTER — Ambulatory Visit: Attending: Cardiovascular Disease | Admitting: Cardiovascular Disease

## 2024-03-09 VITALS — BP 114/68 | HR 77 | Ht 64.0 in | Wt 200.2 lb

## 2024-03-09 DIAGNOSIS — R42 Dizziness and giddiness: Secondary | ICD-10-CM

## 2024-03-09 DIAGNOSIS — I10 Essential (primary) hypertension: Secondary | ICD-10-CM

## 2024-03-09 DIAGNOSIS — E119 Type 2 diabetes mellitus without complications: Secondary | ICD-10-CM | POA: Diagnosis not present

## 2024-03-09 MED ORDER — CHLORTHALIDONE 25 MG PO TABS: 25.0000 mg | ORAL_TABLET | ORAL | 3 refills | Status: AC

## 2024-03-09 MED ORDER — CANDESARTAN CILEXETIL 16 MG PO TABS: 16.0000 mg | ORAL_TABLET | Freq: Every day | ORAL | 3 refills | Status: AC

## 2024-03-09 NOTE — Progress Notes (Signed)
 Cardiology Office Note:    Date:  03/09/2024   ID:  Crystal Logan, DOB 10-18-1950, MRN [id number]  PCP:  Crystal Carwin, MD   [facility] Providers Cardiologist:  None     Referring MD: Crystal Carwin, MD   Chief Complaint  Patient presents with   Dizziness  Crystal Logan is a 73 y.o. female who is being seen today for the evaluation of dizziness at the request of Crystal Carwin, MD.   History of Present Illness:    Crystal Logan is a 73 y.o. female with a hx of essential hypertension and type 2 diabetes mellitus, remote history of right breast cancer treated with surgery and radiation therapy, but without known structural cardiovascular disease, who has been experiencing intermittent episodes of weakness and dizziness over the last several months.  She has not had true vertigo and has not experienced true syncope.  The dizziness occurs only when she is upright, especially if she has been standing for a long time.  Her recently deceased husband used to be my patient and she had to spend a lot of time taking care of him since he was bedridden for the last 6 months of his life..  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  She was so weak that she fell forward onto the kitchen counter but she did not lose consciousness.  She describes fatigue and dizziness when she is walking, but not when exercising on her stationary bike.  She has not had problems with orthopnea, PND, lower extremity edema or claudication.  She has not had any chest pain at rest or with activity.  Over the last several months she has been paying a lot more attention to her own health is eating a much healthier diet, monitoring her glucose carefully, exercising on a daily basis.  She  walks her large dog 3 times a day.  She does not have shortness of breath, just fatigue.  She has not experienced chest pain or any other focal neurological complaints.  She has lost 31 since November.  She has been taking candesartan and chlorthalidone for blood pressure for many years, more recently has been taking Farxiga in addition to metformin for type 2 diabetes mellitus.  Her hemoglobin A1c is improving and most recently was 7.2%.  Often at home she notes that her systolic blood pressure will be in the 100-110 range on those days she takes only half of her prescribed candesartan 32 mg daily.  She tries to stay well-hydrated.  She does not drink alcohol and only drinks 1 cup of week coffee daily.  She has moderate hypercholesterolemia.  She thinks that her total cholesterol was around 230 and her LDL cholesterol was over 130.  She does not remember the exact numbers.  She was prescribed a cholesterol-lowering medication but within a week developed severe lower extremity muscle soreness and weakness and stopped taking it.  She was prescribed an alternative agent but she did not take it.  Past Medical History:  Diagnosis Date   Breast cancer (HCC) 12/2016   right   History of radiation therapy 02/13/17- 03/20/17   Right Breast treated to 50 Gy in 25 fractions   Hypertension    states  under control with med., has been on med. ~ 15 yr.   Personal history of radiation therapy 01/08/2017   Ended 02-01-2017    Past Surgical History:  Procedure Laterality Date   BREAST LUMPECTOMY Right 01/02/2017   BREAST LUMPECTOMY WITH RADIOACTIVE SEED AND SENTINEL LYMPH NODE BIOPSY Right 01/02/2017   Procedure: RIGHT BREAST LUMPECTOMY WITH RADIOACTIVE SEED AND RIGHT AXILLARY SENTINEL LYMPH NODE BIOPSY;  Surgeon: Alm Angle, MD;  Location: [facility];  Service: General;  Laterality: Right;   COLONOSCOPY  2018   RE-EXCISION OF BREAST LUMPECTOMY Right 01/18/2017   Procedure: RE-EXCISION OF BREAST  LUMPECTOMY;  Surgeon: Angle Alm, MD;  [facility] OR;  Service: General;  Laterality: Right;   TONSILLECTOMY      Current Medications: Current Meds  Medication Sig   candesartan (ATACAND) 16 MG tablet Take 1 tablet (16 mg total) by mouth daily.   FARXIGA 10 MG TABS tablet Take 10 mg by mouth daily.   latanoprost (XALATAN) 0.005 % ophthalmic solution SMARTSIG:In Eye(s)   metFORMIN (GLUCOPHAGE) 500 MG tablet Take 500 mg by mouth 2 (two) times daily.   sertraline (ZOLOFT) 50 MG tablet Take 50 mg by mouth daily.   [DISCONTINUED] candesartan (ATACAND) 32 MG tablet Take 32 mg by mouth daily.   [DISCONTINUED] chlorthalidone (HYGROTON) 25 MG tablet Take 25 mg by mouth daily.     Allergies:   No known allergies   Social History   Socioeconomic History   Marital status: Married    Spouse name: Not on file   Number of children: Not on file   Years of education: Not on file   Highest education level: Not on file  Occupational History   Not on file  Tobacco Use   Smoking status: Former    Current packs/day: 0.00    Average packs/day: 0.3 packs/day for 47.0 years (11.8 ttl pk-yrs)    Types: Cigarettes    Start date: 29    Quit date: 2018    Years since quitting: 7.4   Smokeless tobacco: Never  Vaping Use   Vaping status: Never Used  Substance and Sexual Activity   Alcohol use: Yes    Comment: 1-2 times per year   Drug use: No   Sexual activity: Not on file  Other Topics Concern   Not on file  Social History Narrative   Not on file   Social Drivers of Health   Financial Resource Strain: Not on file  Food Insecurity: Not on file  Transportation Needs: Not on file  Physical Activity: Not on file  Stress: Not on file  Social Connections: Not on file     Family History: The patient's family history is negative for Colon cancer, Colon polyps, Esophageal cancer, Rectal cancer, and Stomach cancer.  ROS:   Please see the history of present illness.     All other  systems reviewed and are negative.  EKGs/Labs/Other Studies Reviewed:    The following studies were reviewed today:  EKG Interpretation Date/Time:  Monday March 09 2024 09:53:20 EDT Ventricular Rate:  76 PR Interval:  178 QRS Duration:  102 QT Interval:  396 QTC Calculation: 445 R Axis:   20  Text Interpretation: Normal sinus rhythm Low voltage QRS When compared with ECG of 26-Jul-2023 13:27, No significant change was found Confirmed by Wilfrido Luedke ([id number]) on 03/09/2024 10:40:17 AM    Recent Labs: 10/28/2023: ALT 23; BUN 29; Creatinine 0.78; Hemoglobin 12.8; Platelet Count 293; Potassium 3.7; Sodium 133  Recent  Lipid Panel No results found for: CHOL, TRIG, HDL, CHOLHDL, VLDL, LDLCALC, LDLDIRECT   Risk Assessment/Calculations:                Physical Exam:    VS:  BP 114/68   Pulse 77   Ht 5' 4 (1.626 m)   Wt 200 lb 3.2 oz (90.8 kg)   SpO2 95%   BMI 34.36 kg/m     Wt Readings from Last 3 Encounters:  03/09/24 200 lb 3.2 oz (90.8 kg)  10/28/23 212 lb 14.4 oz (96.6 kg)  07/27/23 231 lb 7.7 oz (105 kg)     GEN: Moderately obese, especially with central adiposity, but appears fit, well nourished, well developed in no acute distress HEENT: Normal NECK: No JVD; No carotid bruits LYMPHATICS: No lymphadenopathy CARDIAC: RRR, no murmurs, rubs, gallops RESPIRATORY:  Clear to auscultation without rales, wheezing or rhonchi  ABDOMEN: Soft, non-tender, non-distended MUSCULOSKELETAL:  No edema; No deformity  SKIN: Warm and dry NEUROLOGIC:  Alert and oriented x 3 PSYCHIATRIC:  Normal affect   ASSESSMENT:    1. Dizziness   2. Essential hypertension   3. Type 2 diabetes mellitus without complication, without long-term current use of insulin (HCC)    PLAN:    In order of problems listed above:  Dizziness/fatigue: I suspect that these are manifestations of transient hypotension, especially orthostatic hypotension.  She has lost 30 pounds in weight and  is taking 2 medications with diuretic effect (a thiazide for her blood pressure and SGLT2 inhibitor for her diabetes mellitus).  Reduce the candesartan to 16 mg daily.  Reduce the chlorthalidone to 25 mg every other day.  Told her we will take a couple of weeks to achieve  a new steady state.  Asked her to send me a blood pressure log in 2 or 3 weeks. DM: Congratulated on her commitment to healthy diet and regular exercise.  She has already managed to lose 30 pounds which is excellent. HLP: Will get a copy of her lipids and I recommended getting an updated lipid profile in a few months.  Target LDL less than 100 since she has diabetes mellitus.     Medication Adjustments/Labs and Tests Ordered: Current medicines are reviewed at length with the patient today.  Concerns regarding medicines are outlined above.  Orders Placed This Encounter  Procedures   EKG 12-Lead   Meds ordered this encounter  Medications   chlorthalidone (HYGROTON) 25 MG tablet    Sig: Take 1 tablet (25 mg total) by mouth every other day.    Dispense:  45 tablet    Refill:  3   candesartan (ATACAND) 16 MG tablet    Sig: Take 1 tablet (16 mg total) by mouth daily.    Dispense:  90 tablet    Refill:  3    Patient Instructions  Medication Instructions:  Take Chlorthalidone 25 mg Every other Day Decrease Candesartan to 16 mg a day  *If you need a refill on your cardiac medications before your next appointment, please call your pharmacy*  Keep a BP log for 2-3 weeks and send in through MyChart  Follow-Up: At [facility], you and your health needs are our priority.  As part of our continuing mission to provide you with exceptional heart care, our providers are all part of one team.  This team includes your primary Cardiologist (physician) and Advanced Practice Providers or APPs (Physician Assistants and Nurse Practitioners) who all work together to provide you with  the care you need, when you need it.  Your  next appointment:    3-4 months  Provider:   Dr Francyne  We recommend signing up for the patient portal called MyChart.  Sign up information is provided on this After Visit Summary.  MyChart is used to connect with patients for Virtual Visits (Telemedicine).  Patients are able to view lab/test results, encounter notes, upcoming appointments, etc.  Non-urgent messages can be sent to your provider as well.   To learn more about what you can do with MyChart, go to [email or web address].          Signed, Jerel Francyne, MD  03/09/2024 11:46 AM    [facility]

## 2024-03-09 NOTE — Patient Instructions (Signed)
 Medication Instructions:  Take Chlorthalidone 25 mg Every other Day Decrease Candesartan to 16 mg a day  *If you need a refill on your cardiac medications before your next appointment, please call your pharmacy*  Keep a BP log for 2-3 weeks and send in through MyChart  Follow-Up: At [facility], you and your health needs are our priority.  As part of our continuing mission to provide you with exceptional heart care, our providers are all part of one team.  This team includes your primary Cardiologist (physician) and Advanced Practice Providers or APPs (Physician Assistants and Nurse Practitioners) who all work together to provide you with the care you need, when you need it.  Your next appointment:    3-4 months  Provider:   Dr Francyne  We recommend signing up for the patient portal called MyChart.  Sign up information is provided on this After Visit Summary.  MyChart is used to connect with patients for Virtual Visits (Telemedicine).  Patients are able to view lab/test results, encounter notes, upcoming appointments, etc.  Non-urgent messages can be sent to your provider as well.   To learn more about what you can do with MyChart, go to [email or web address].

## 2024-04-23 ENCOUNTER — Ambulatory Visit
Admission: RE | Admit: 2024-04-23 | Discharge: 2024-04-23 | Disposition: A | Discharge status: Home / Self Care | Source: Ambulatory Visit | Attending: Nurse Practitioner | Admitting: Nurse Practitioner

## 2024-04-23 DIAGNOSIS — Z1231 Encounter for screening mammogram for malignant neoplasm of breast: Secondary | ICD-10-CM

## 2024-06-08 ENCOUNTER — Ambulatory Visit: Attending: Cardiovascular Disease | Admitting: Cardiovascular Disease

## 2024-06-08 ENCOUNTER — Encounter: Payer: Self-pay | Admitting: Cardiovascular Disease

## 2024-06-08 ENCOUNTER — Telehealth: Payer: Self-pay | Admitting: Pharmacy Technician

## 2024-06-08 VITALS — BP 134/72 | HR 74 | Ht 64.0 in | Wt 200.0 lb

## 2024-06-08 DIAGNOSIS — E7800 Pure hypercholesterolemia, unspecified: Secondary | ICD-10-CM

## 2024-06-08 DIAGNOSIS — I10 Essential (primary) hypertension: Secondary | ICD-10-CM | POA: Diagnosis not present

## 2024-06-08 DIAGNOSIS — E119 Type 2 diabetes mellitus without complications: Secondary | ICD-10-CM

## 2024-06-08 MED ORDER — EZETIMIBE 10 MG PO TABS: 10.0000 mg | ORAL_TABLET | Freq: Every day | ORAL | 3 refills | Status: AC

## 2024-06-08 MED ORDER — XIGDUO XR 10-1000 MG PO TB24: 1.0000 | ORAL_TABLET | Freq: Every day | ORAL | 3 refills | Status: AC

## 2024-06-08 MED ORDER — DAPAGLIFLOZIN PRO-METFORMIN ER 5-500 MG PO TB24: 1.0000 | ORAL_TABLET | Freq: Two times a day (BID) | ORAL | 3 refills | Status: DC

## 2024-06-08 NOTE — Telephone Encounter (Signed)
Will forward to Dr Croitoru for review  

## 2024-06-08 NOTE — Progress Notes (Signed)
 Cardiology Office Note:    Date:  06/08/2024   ID:  Crystal Logan, DOB 10/29/1950, MRN [id number]  PCP:  Crystal Carwin, MD   [facility] Providers Cardiologist:  None     Referring MD: Crystal Carwin, MD   Chief Complaint  Patient presents with   Hypertension     History of Present Illness:    Crystal Logan is a 73 y.o. female with a hx of essential hypertension and type 2 diabetes mellitus, hypercholesterolemia,remote history of right breast cancer treated with surgery and radiation therapy, but without known structural cardiovascular disease.  She returns in follow-up after adjustments made to her antihypertensive medications.  She was having a lot of problems with dizziness, after starting a regimen of regular physical exercise and losing some weight.  She feels better after reducing her dose of candesartan .  Her blood pressure remains well-controlled.  She still has occasional orthostatic lightheadedness if she bends over and stands up too fast.  She has not been able to curb her appetite and lose additional weight, but she continues to exercise on a daily basis.  She walks her dog 3 times a day, she exercises on the stationary bike and she lifts weights.  She has not had any problems with chest pain or shortness of breath at rest or with activity.  She denies palpitations or syncope.  She complains of numbness in digits 3/5 in her right hand.  She has no problems with the grip in her right hand.  She has been unable to afford Doreen which now cost $300 a month.  She has been taking her metformin.  While she was taking both medications her hemoglobin A1c had improved to 7.2%.  She is due for repeat hemoglobin A1c with her PCP next month.  She has moderate hypercholesterolemia.  She was prescribed a statin but within a week developed severe  soreness and weakness in the muscles of her lower extremities.  She did very well on nexlizet, but this is also  unaffordable.  Her total cholesterol was 217, HDL 77, LDL 119, triglycerides 121 and hemoglobin A1c 7.9% prior to starting treatment with any cholesterol-lowering medications in April 2024.  She had normal renal function with a creatinine of 0.94, but mildly elevated ALT at 54.    Past Medical History:  Diagnosis Date   Breast cancer (HCC) 12/2016   right   History of radiation therapy 02/13/17- 03/20/17   Right Breast treated to 50 Gy in 25 fractions   Hypertension    states under control with med., has been on med. ~ 15 yr.   Personal history of radiation therapy 01/08/2017   Ended 02-01-2017    Past Surgical History:  Procedure Laterality Date   BREAST LUMPECTOMY Right 01/02/2017   BREAST LUMPECTOMY WITH RADIOACTIVE SEED AND SENTINEL LYMPH NODE BIOPSY Right 01/02/2017   Procedure: RIGHT BREAST LUMPECTOMY WITH RADIOACTIVE SEED AND RIGHT AXILLARY SENTINEL LYMPH NODE BIOPSY;  Surgeon: Crystal Angle, MD;  Location: [facility];  Service: General;  Laterality: Right;   COLONOSCOPY  2018   RE-EXCISION OF BREAST LUMPECTOMY Right 01/18/2017   Procedure: RE-EXCISION OF BREAST LUMPECTOMY;  Surgeon: Logan Alm, MD;  Location: [facility] OR;  Service: General;  Laterality: Right;   TONSILLECTOMY      Current Medications: Current Meds  Medication Sig   candesartan  (ATACAND ) 16 MG tablet Take 1 tablet (16 mg total) by mouth daily.   chlorthalidone  (HYGROTON ) 25 MG tablet Take 1  tablet (25 mg total) by mouth every other day.   Dapagliflozin Pro-metFORMIN ER (XIGDUO XR) 06-999 MG TB24 Take 1 tablet by mouth daily.   ezetimibe (ZETIA) 10 MG tablet Take 1 tablet (10 mg total) by mouth daily.   FARXIGA 10 MG TABS tablet Take 10 mg by mouth daily.   latanoprost (XALATAN) 0.005 % ophthalmic solution SMARTSIG:In Eye(s)   metFORMIN (GLUCOPHAGE) 500 MG tablet Take 500 mg by mouth 2 (two) times daily.   sertraline (ZOLOFT) 50 MG tablet Take 50 mg by mouth daily.   [DISCONTINUED] Dapagliflozin  Pro-metFORMIN ER 5-500 MG TB24 Take 1 tablet by mouth 2 (two) times daily.     Allergies:   No known allergies      Family History: The patient's family history is negative for Colon cancer, Colon polyps, Esophageal cancer, Rectal cancer, and Stomach cancer.  ROS:   Please see the history of present illness.     All other systems reviewed and are negative.  EKGs/Labs/Other Studies Reviewed:    The following studies were reviewed today:       Recent Labs: 10/28/2023: ALT 23; BUN 29; Creatinine 0.78; Hemoglobin 12.8; Platelet Count 293; Potassium 3.7; Sodium 133  Recent Lipid Panel No results found for: CHOL, TRIG, HDL, CHOLHDL, VLDL, LDLCALC, LDLDIRECT April 2024 total cholesterol 217, HDL 77, LDL 119, triglycerides 121, hemoglobin A1c 7.9% prior to starting treatment with any cholesterol-lowering medications   Risk Assessment/Calculations:     ECG from 03/09/2024 personally reviewed shows normal sinus rhythm, normal tracing except for generalized low voltage.           Physical Exam:    VS:  BP 134/72   Pulse 74   Ht 5' 4 (1.626 m)   Wt 200 lb (90.7 kg)   SpO2 94%   BMI 34.33 kg/m     Wt Readings from Last 3 Encounters:  06/08/24 200 lb (90.7 kg)  03/09/24 200 lb 3.2 oz (90.8 kg)  10/28/23 212 lb 14.4 oz (96.6 kg)      General: Alert, oriented x3, no distress, moderately obese, but also appears quite fit Head: no evidence of trauma, PERRL, EOMI, no exophtalmos or lid lag, no myxedema, no xanthelasma; normal ears, nose and oropharynx Neck: normal jugular venous pulsations and no hepatojugular reflux; brisk carotid pulses without delay and no carotid bruits Chest: clear to auscultation, no signs of consolidation by percussion or palpation, normal fremitus, symmetrical and full respiratory excursions Cardiovascular: normal position and quality of the apical impulse, regular rhythm, normal first and second heart sounds, no murmurs, rubs or  gallops Abdomen: no tenderness or distention, no masses by palpation, no abnormal pulsatility or arterial bruits, normal bowel sounds, no hepatosplenomegaly Extremities: no clubbing, cyanosis or edema; 2+ radial, ulnar and brachial pulses bilaterally; 2+ right femoral, posterior tibial and dorsalis pedis pulses; 2+ left femoral, posterior tibial and dorsalis pedis pulses; no subclavian or femoral bruits Neurological: grossly nonfocal Psych: Normal mood and affect   ASSESSMENT:    1. Essential hypertension     PLAN:    In order of problems listed above:  HTN: Well-controlled after reducing the dose of candesartan .  Still has a few symptoms of orthostatic hypotension but milder.   DM: Her effort to achieve weight loss has stalled, but she has not gained the weight back.  Unfortunately she can no longer afford the expensive prescription for Farxiga.  Gave her a prescription for Xigduo today to see if maybe this is more affordable since it  is listed as a preferred agent on her insurance formulary. HLP: Target LDL cholesterol less than 899.  She developed myopathy with statins and does not want to try again.  We might be able to get her to target range with just ezetimibe 10 mg once daily.  Will start this and recheck labs in 2-3 months.     Medication Adjustments/Labs and Tests Ordered: Current medicines are reviewed at length with the patient today.  Concerns regarding medicines are outlined above.  Orders Placed This Encounter  Procedures   Lipid panel   Meds ordered this encounter  Medications   ezetimibe (ZETIA) 10 MG tablet    Sig: Take 1 tablet (10 mg total) by mouth daily.    Dispense:  90 tablet    Refill:  3   Dapagliflozin Pro-metFORMIN ER (XIGDUO XR) 06-999 MG TB24    Sig: Take 1 tablet by mouth daily.    Dispense:  90 tablet    Refill:  3    Patient Instructions  Medication Instructions:   Your physician recommends that you continue on your current medications as  directed. Please refer to the Current Medication list given to you today.   *If you need a refill on your cardiac medications before your next appointment, please call your pharmacy*    Lab Work:  IN 2 - 3 MONTHS  GET LIPID PANEL       If you have labs (blood work) drawn today and your tests are completely normal, you will receive your results only by: MyChart Message (if you have MyChart) OR A paper copy in the mail If you have any lab test that is abnormal or we need to change your treatment, we will call you to review the results.   Testing/Procedures: NONE ORDERED  TODAY       Follow-Up: At [facility], you and your health needs are our priority.  As part of our continuing mission to provide you with exceptional heart care, our providers are all part of one team.  This team includes your primary Cardiologist (physician) and Advanced Practice Providers or APPs (Physician Assistants and Nurse Practitioners) who all work together to provide you with the care you need, when you need it.   Your next appointment:    1 year(s)  Provider:  Jerel Balding, MD        We recommend signing up for the patient portal called MyChart.  Sign up information is provided on this After Visit Summary.  MyChart is used to connect with patients for Virtual Visits (Telemedicine).  Patients are able to view lab/test results, encounter notes, upcoming appointments, etc.  Non-urgent messages can be sent to your provider as well.   To learn more about what you can do with MyChart, go to [email or web address].   Other Instructions              Signed, Jerel Balding, MD  06/08/2024 2:41 PM    [facility]

## 2024-06-08 NOTE — Telephone Encounter (Signed)
 Sorry, I entered that wrong.  Will re-enter Xigduo 06-999 once daily.

## 2024-06-08 NOTE — Telephone Encounter (Signed)
 Hi, insurance is asking why she needs to have 1 tablet twice a day instead of the normal dosing of 1 tablet a day. I couldn't find that in the notes. Can someone tell me why so I can tell insurance? Also I see farxiga 10mg  1 a day and metformin 500mg  1 twice a day are active on her file. Thank you

## 2024-06-08 NOTE — Patient Instructions (Addendum)
 Medication Instructions:   Your physician recommends that you continue on your current medications as directed. Please refer to the Current Medication list given to you today.   *If you need a refill on your cardiac medications before your next appointment, please call your pharmacy*    Lab Work:  IN 2 - 3 MONTHS  GET LIPID PANEL       If you have labs (blood work) drawn today and your tests are completely normal, you will receive your results only by: MyChart Message (if you have MyChart) OR A paper copy in the mail If you have any lab test that is abnormal or we need to change your treatment, we will call you to review the results.   Testing/Procedures: NONE ORDERED  TODAY       Follow-Up: At [facility], you and your health needs are our priority.  As part of our continuing mission to provide you with exceptional heart care, our providers are all part of one team.  This team includes your primary Cardiologist (physician) and Advanced Practice Providers or APPs (Physician Assistants and Nurse Practitioners) who all work together to provide you with the care you need, when you need it.   Your next appointment:    1 year(s)  Provider:  Jerel Balding, MD        We recommend signing up for the patient portal called MyChart.  Sign up information is provided on this After Visit Summary.  MyChart is used to connect with patients for Virtual Visits (Telemedicine).  Patients are able to view lab/test results, encounter notes, upcoming appointments, etc.  Non-urgent messages can be sent to your provider as well.   To learn more about what you can do with MyChart, go to [email or web address].   Other Instructions

## 2024-08-21 ENCOUNTER — Other Ambulatory Visit: Payer: Self-pay | Admitting: Internal Medicine

## 2024-08-21 ENCOUNTER — Ambulatory Visit
Admission: RE | Admit: 2024-08-21 | Discharge: 2024-08-21 | Disposition: A | Source: Ambulatory Visit | Attending: Internal Medicine | Admitting: Internal Medicine

## 2024-08-21 DIAGNOSIS — Z9181 History of falling: Secondary | ICD-10-CM

## 2024-08-21 DIAGNOSIS — M25551 Pain in right hip: Secondary | ICD-10-CM

## 2024-10-26 ENCOUNTER — Inpatient Hospital Stay: Payer: Medicare Other

## 2024-10-26 ENCOUNTER — Inpatient Hospital Stay: Payer: Medicare Other | Admitting: Nurse Practitioner
